# Patient Record
Sex: Female | Born: 1980
Health system: Southern US, Community
[De-identification: ages and names within clinical notes are randomized; demographics above are authoritative.]

## PROBLEM LIST (undated history)

## (undated) DIAGNOSIS — G35 Multiple sclerosis: Secondary | ICD-10-CM

## (undated) DIAGNOSIS — G35D Multiple sclerosis, unspecified: Secondary | ICD-10-CM

## (undated) DIAGNOSIS — D649 Anemia, unspecified: Secondary | ICD-10-CM

## (undated) HISTORY — PX: WISDOM TOOTH EXTRACTION: SHX21

## (undated) HISTORY — DX: Multiple sclerosis: G35

## (undated) HISTORY — DX: Multiple sclerosis, unspecified: G35.D

## (undated) HISTORY — DX: Anemia, unspecified: D64.9

---

## 2005-09-13 ENCOUNTER — Emergency Department: Payer: Self-pay | Admitting: Emergency Medicine

## 2005-09-13 ENCOUNTER — Other Ambulatory Visit: Payer: Self-pay

## 2005-09-17 ENCOUNTER — Ambulatory Visit: Payer: Self-pay | Admitting: Emergency Medicine

## 2007-07-24 ENCOUNTER — Emergency Department: Payer: Self-pay | Admitting: Emergency Medicine

## 2009-09-01 ENCOUNTER — Emergency Department: Payer: Self-pay | Admitting: Emergency Medicine

## 2009-09-08 ENCOUNTER — Emergency Department: Payer: Self-pay | Admitting: Emergency Medicine

## 2010-02-09 ENCOUNTER — Observation Stay: Payer: Self-pay | Admitting: Obstetrics and Gynecology

## 2010-03-19 ENCOUNTER — Inpatient Hospital Stay: Payer: Self-pay

## 2014-02-01 ENCOUNTER — Emergency Department: Payer: Self-pay | Admitting: Emergency Medicine

## 2014-02-01 LAB — CBC
HCT: 36 % (ref 35.0–47.0)
HGB: 11.6 g/dL — AB (ref 12.0–16.0)
MCH: 27.5 pg (ref 26.0–34.0)
MCHC: 32.1 g/dL (ref 32.0–36.0)
MCV: 86 fL (ref 80–100)
Platelet: 300 10*3/uL (ref 150–440)
RBC: 4.21 10*6/uL (ref 3.80–5.20)
RDW: 13.6 % (ref 11.5–14.5)
WBC: 11.3 10*3/uL — ABNORMAL HIGH (ref 3.6–11.0)

## 2014-02-01 LAB — COMPREHENSIVE METABOLIC PANEL
ALBUMIN: 4.1 g/dL (ref 3.4–5.0)
AST: 17 U/L (ref 15–37)
Alkaline Phosphatase: 60 U/L (ref 46–116)
Anion Gap: 9 (ref 7–16)
BUN: 8 mg/dL (ref 7–18)
Bilirubin,Total: 0.2 mg/dL (ref 0.2–1.0)
CALCIUM: 9.2 mg/dL (ref 8.5–10.1)
CO2: 26 mmol/L (ref 21–32)
Chloride: 106 mmol/L (ref 98–107)
Creatinine: 0.61 mg/dL (ref 0.60–1.30)
EGFR (African American): >60
Glucose: 83 mg/dL (ref 65–99)
Osmolality: 279 (ref 275–301)
POTASSIUM: 3.7 mmol/L (ref 3.5–5.1)
SGPT (ALT): 34 U/L (ref 14–63)
SODIUM: 141 mmol/L (ref 136–145)
Total Protein: 7.5 g/dL (ref 6.4–8.2)

## 2014-03-06 ENCOUNTER — Ambulatory Visit: Payer: Self-pay | Admitting: Nurse Practitioner

## 2014-04-19 ENCOUNTER — Ambulatory Visit
Admit: 2014-04-19 | Disposition: A | Discharge status: Home / Self Care | Payer: Self-pay | Attending: Neurology | Admitting: Neurology

## 2014-04-20 ENCOUNTER — Ambulatory Visit
Admit: 2014-04-20 | Disposition: A | Discharge status: Home / Self Care | Payer: Self-pay | Attending: Neurology | Admitting: Neurology

## 2014-04-21 ENCOUNTER — Other Ambulatory Visit: Payer: Self-pay | Admitting: Neurology

## 2014-04-21 ENCOUNTER — Ambulatory Visit
Admit: 2014-04-21 | Disposition: A | Discharge status: Home / Self Care | Payer: Self-pay | Attending: Neurology | Admitting: Neurology

## 2014-04-21 DIAGNOSIS — G939 Disorder of brain, unspecified: Secondary | ICD-10-CM

## 2014-04-22 ENCOUNTER — Ambulatory Visit
Admit: 2014-04-22 | Disposition: A | Discharge status: Home / Self Care | Payer: Self-pay | Attending: Neurology | Admitting: Neurology

## 2014-04-23 ENCOUNTER — Ambulatory Visit
Admit: 2014-04-23 | Disposition: A | Discharge status: Home / Self Care | Payer: Self-pay | Attending: Neurology | Admitting: Neurology

## 2014-05-05 ENCOUNTER — Other Ambulatory Visit: Payer: Self-pay | Admitting: Neurology

## 2014-05-05 DIAGNOSIS — G939 Disorder of brain, unspecified: Secondary | ICD-10-CM

## 2014-05-07 ENCOUNTER — Ambulatory Visit: Admission: RE | Admit: 2014-05-07 | Payer: Self-pay | Source: Ambulatory Visit

## 2014-05-10 ENCOUNTER — Telehealth: Payer: Self-pay | Admitting: *Deleted

## 2014-05-10 ENCOUNTER — Other Ambulatory Visit: Payer: Self-pay | Admitting: Diagnostic Radiology

## 2014-05-10 DIAGNOSIS — R519 Headache, unspecified: Secondary | ICD-10-CM

## 2014-05-10 DIAGNOSIS — R51 Headache: Principal | ICD-10-CM

## 2014-05-10 NOTE — Telephone Encounter (Signed)
Spoke with patient on 05/06/14.  Confirmed appt. Date/time.   Pre-procedure instructions reviewed.  Patient states she is not taking any anti-coagulants.

## 2014-05-11 ENCOUNTER — Ambulatory Visit
Admission: RE | Admit: 2014-05-11 | Discharge: 2014-05-11 | Disposition: A | Discharge status: Home / Self Care | Payer: Medicaid Other | Source: Ambulatory Visit | Attending: Neurology | Admitting: Neurology

## 2014-05-11 DIAGNOSIS — G35 Multiple sclerosis: Secondary | ICD-10-CM | POA: Diagnosis not present

## 2014-05-11 DIAGNOSIS — G939 Disorder of brain, unspecified: Secondary | ICD-10-CM

## 2014-05-11 LAB — ALBUMIN: Albumin: 4.3 g/dL (ref 3.5–5.0)

## 2014-05-11 LAB — CBC
HCT: 37.9 % (ref 35.0–47.0)
HEMOGLOBIN: 12.1 g/dL (ref 12.0–16.0)
MCH: 27.4 pg (ref 26.0–34.0)
MCHC: 31.8 g/dL — ABNORMAL LOW (ref 32.0–36.0)
MCV: 86 fL (ref 80.0–100.0)
Platelets: 251 10*3/uL (ref 150–440)
RBC: 4.41 MIL/uL (ref 3.80–5.20)
RDW: 14.1 % (ref 11.5–14.5)
WBC: 7.4 10*3/uL (ref 3.6–11.0)

## 2014-05-11 LAB — GLUCOSE, CSF: GLUCOSE CSF: 57 mg/dL (ref 40–70)

## 2014-05-11 LAB — CSF CELL COUNT WITH DIFFERENTIAL
EOS CSF: 0 %
LYMPHS CSF: 89 %
Monocyte-Macrophage-Spinal Fluid: 11 %
Other Cells, CSF: 0
RBC Count, CSF: 10 /mm3 — ABNORMAL HIGH (ref 0–3)
SEGMENTED NEUTROPHILS-CSF: 0 %
Tube #: 1
WBC, CSF: 2 /mm3

## 2014-05-11 LAB — PROTIME-INR
INR: 0.97
Prothrombin Time: 13.1 seconds (ref 11.4–15.0)

## 2014-05-11 LAB — HCG, QUANTITATIVE, PREGNANCY: hCG, Beta Chain, Quant, S: <1 m[IU]/mL (ref <5)

## 2014-05-11 LAB — PROTEIN, CSF: TOTAL PROTEIN, CSF: 19 mg/dL (ref 15–45)

## 2014-05-11 MED ORDER — ACETAMINOPHEN 325 MG PO TABS
650.0000 mg | ORAL_TABLET | ORAL | Status: DC | PRN
  Filled 2014-05-11: qty 2

## 2014-05-11 NOTE — Progress Notes (Signed)
Patient ID: Monica Wilkins, female   DOB: 16-Nov-1980, 34 y.o.   MRN: [id number] L2-3 LP with single pass 22g spinal needle.  8 cc clear CSF collected and sent for labs per request.  No immediate complications.

## 2014-05-12 LAB — IGG: IgG (Immunoglobin G), Serum: 1225 mg/dL (ref 700–1600)

## 2014-05-13 LAB — OLIGOCLONAL BANDS, CSF + SERM

## 2014-05-13 LAB — CSF IGG: IGG CSF: 3.3 mg/dL (ref 0.0–8.6)

## 2014-05-15 LAB — CSF CULTURE: CULTURE: NO GROWTH

## 2014-05-15 LAB — CSF CULTURE W GRAM STAIN

## 2014-07-06 ENCOUNTER — Other Ambulatory Visit: Payer: Self-pay | Admitting: Neurology

## 2014-07-06 DIAGNOSIS — R9089 Other abnormal findings on diagnostic imaging of central nervous system: Secondary | ICD-10-CM

## 2014-07-12 ENCOUNTER — Ambulatory Visit
Admission: RE | Admit: 2014-07-12 | Discharge: 2014-07-12 | Disposition: A | Discharge status: Home / Self Care | Payer: Medicaid Other | Source: Ambulatory Visit | Attending: Neurology | Admitting: Neurology

## 2014-07-12 DIAGNOSIS — R9089 Other abnormal findings on diagnostic imaging of central nervous system: Secondary | ICD-10-CM | POA: Diagnosis present

## 2014-07-12 DIAGNOSIS — M4802 Spinal stenosis, cervical region: Secondary | ICD-10-CM | POA: Insufficient documentation

## 2014-07-12 DIAGNOSIS — M5092 Cervical disc disorder, unspecified, mid-cervical region: Secondary | ICD-10-CM | POA: Insufficient documentation

## 2014-07-12 DIAGNOSIS — Z09 Encounter for follow-up examination after completed treatment for conditions other than malignant neoplasm: Secondary | ICD-10-CM | POA: Diagnosis not present

## 2014-07-12 MED ORDER — GADOBENATE DIMEGLUMINE 529 MG/ML IV SOLN
20.0000 mL | Freq: Once | INTRAVENOUS | Status: AC | PRN
  Administered 2014-07-12: 20 mL via INTRAVENOUS

## 2015-11-23 ENCOUNTER — Ambulatory Visit
Admission: RE | Admit: 2015-11-23 | Discharge: 2015-11-23 | Disposition: A | Discharge status: Home / Self Care | Payer: Medicaid Other | Source: Ambulatory Visit | Attending: Nurse Practitioner | Admitting: Nurse Practitioner

## 2015-11-23 ENCOUNTER — Other Ambulatory Visit: Payer: Self-pay | Admitting: Nurse Practitioner

## 2015-11-23 DIAGNOSIS — R0600 Dyspnea, unspecified: Secondary | ICD-10-CM | POA: Diagnosis present

## 2016-10-22 ENCOUNTER — Encounter: Payer: Self-pay | Admitting: Obstetrics and Gynecology

## 2016-10-22 ENCOUNTER — Ambulatory Visit (INDEPENDENT_AMBULATORY_CARE_PROVIDER_SITE_OTHER): Payer: Medicaid Other | Admitting: Obstetrics and Gynecology

## 2016-10-22 VITALS — BP 103/59 | HR 81 | Wt 179.3 lb

## 2016-10-22 DIAGNOSIS — Z3491 Encounter for supervision of normal pregnancy, unspecified, first trimester: Secondary | ICD-10-CM | POA: Diagnosis not present

## 2016-10-22 LAB — POCT URINALYSIS DIPSTICK
Bilirubin, UA: NEGATIVE
Glucose, UA: NEGATIVE
KETONES UA: NEGATIVE
Leukocytes, UA: NEGATIVE
NITRITE UA: NEGATIVE
PH UA: 7 (ref 5.0–8.0)
Spec Grav, UA: 1.025 (ref 1.010–1.025)
Urobilinogen, UA: 0.2 E.U./dL

## 2016-10-22 NOTE — Progress Notes (Signed)
ROB- Pt is c/o nausea but have not taken any medication,

## 2016-10-24 ENCOUNTER — Other Ambulatory Visit: Payer: Medicaid Other

## 2016-10-24 ENCOUNTER — Ambulatory Visit (INDEPENDENT_AMBULATORY_CARE_PROVIDER_SITE_OTHER): Payer: Medicaid Other | Admitting: Obstetrics and Gynecology

## 2016-10-24 ENCOUNTER — Encounter: Payer: Self-pay | Admitting: Obstetrics and Gynecology

## 2016-10-24 VITALS — BP 98/61 | HR 82 | Wt 175.4 lb

## 2016-10-24 DIAGNOSIS — O09521 Supervision of elderly multigravida, first trimester: Secondary | ICD-10-CM

## 2016-10-24 DIAGNOSIS — G35 Multiple sclerosis: Secondary | ICD-10-CM

## 2016-10-24 DIAGNOSIS — Z3491 Encounter for supervision of normal pregnancy, unspecified, first trimester: Secondary | ICD-10-CM

## 2016-10-24 LAB — POCT URINALYSIS DIPSTICK
BILIRUBIN UA: NEGATIVE
GLUCOSE UA: NEGATIVE
Ketones, UA: NEGATIVE
Leukocytes, UA: NEGATIVE
Nitrite, UA: NEGATIVE
Protein, UA: NEGATIVE
RBC UA: NEGATIVE
SPEC GRAV UA: 1.01 (ref 1.010–1.025)
Urobilinogen, UA: 0.2 E.U./dL
pH, UA: 7.5 (ref 5.0–8.0)

## 2016-10-24 NOTE — Progress Notes (Signed)
HPI:      [name] is a 36 y.o. [id number] who LMP was Patient's last menstrual period was 08/01/2016 (approximate).  Subjective:   She presents today for her new OB visit.-She does not know her menstrual period dating. She is not having any complications with this pregnancy. She has a history of multiple sclerosis and has discontinued her medication because she is pregnant.  She did this with her previous pregnancy and had no issues. She reports that her last Pap was normal but she has had some slightly abnormal ones in the past.    Hx: The following portions of the patient's history were reviewed and updated as appropriate:             She  has a past medical history of Anemia and MS (multiple sclerosis) (HCC). She  does not have a problem list on file. She  has no past surgical history on file. Her family history is not on file. She  reports that she has never smoked. She has never used smokeless tobacco. She reports that she does not drink alcohol or use drugs. She has No Known Allergies.       Review of Systems:  Review of Systems  Constitutional: Denied constitutional symptoms, night sweats, recent illness, fatigue, fever, insomnia and weight loss.  Eyes: Denied eye symptoms, eye pain, photophobia, vision change and visual disturbance.  Ears/Nose/Throat/Neck: Denied ear, nose, throat or neck symptoms, hearing loss, nasal discharge, sinus congestion and sore throat.  Cardiovascular: Denied cardiovascular symptoms, arrhythmia, chest pain/pressure, edema, exercise intolerance, orthopnea and palpitations.  Respiratory: Denied pulmonary symptoms, asthma, pleuritic pain, productive sputum, cough, dyspnea and wheezing.  Gastrointestinal: Denied, gastro-esophageal reflux, melena, nausea and vomiting.  Genitourinary: Denied genitourinary symptoms including symptomatic vaginal discharge, pelvic relaxation issues, and urinary complaints.  Musculoskeletal: Denied musculoskeletal  symptoms, stiffness, swelling, muscle weakness and myalgia.  Dermatologic: Denied dermatology symptoms, rash and scar.  Neurologic: Denied neurology symptoms, dizziness, headache, neck pain and syncope.  Psychiatric: Denied psychiatric symptoms, anxiety and depression.  Endocrine: Denied endocrine symptoms including hot flashes and night sweats.   Meds:   Current Outpatient Prescriptions on File Prior to Visit  Medication Sig Dispense Refill  . ferrous fumarate (FERRO-SEQUELS) 50 MG CR tablet Take 50 mg by mouth daily.    . Prenatal MV & Min w/FA-DHA (PRENATAL ADULT GUMMY/DHA/FA PO) Take by mouth.     No current facility-administered medications on file prior to visit.     Objective:     Vitals:   10/24/16 1204  BP: 98/61  Pulse: 82              Physical examination General NAD, Conversant  HEENT Atraumatic; Op clear with mmm.  Normo-cephalic. Pupils reactive. Anicteric sclerae  Thyroid/Neck Smooth without nodularity or enlargement. Normal ROM.  Neck Supple.  Skin No rashes, lesions or ulceration. Normal palpated skin turgor. No nodularity.  Breasts: No masses or discharge.  Symmetric.  No axillary adenopathy.  Lungs: Clear to auscultation.No rales or wheezes. Normal Respiratory effort, no retractions.  Heart: NSR.  No murmurs or rubs appreciated. No periferal edema  Abdomen: Soft.  Non-tender.  No masses.  No HSM. No hernia  Extremities: Moves all appropriately.  Normal ROM for age. No lymphadenopathy.  Neuro: Oriented to PPT.  Normal mood. Normal affect.     Pelvic:   Vulva: Normal appearance.  No lesions.  Vagina: No lesions or abnormalities noted.  Support: Normal pelvic support.  Urethra No masses tenderness  or scarring.  Meatus Normal size without lesions or prolapse.  Cervix: Normal appearance.  No lesions.  Anus: Normal exam.  No lesions.  Perineum: Normal exam.  No lesions.        Bimanual   Adnexae: No masses.  Non-tender to palpation.  Uterus: Enlarged.  13wks  Non-tender.  Mobile.  AV.  Pos FHT's  Adnexae: No masses.  Non-tender to palpation.  Cul-de-sac: Negative for abnormality.  Adnexae: No masses.  Non-tender to palpation.         Pelvimetry   Diagonal: Reached.  Spines: Average.  Sacrum: Concave.  Pubic Arch: Normal.      Assessment:    G3P2002 There are no active problems to display for this patient.    1. First trimester pregnancy   2. Advanced maternal age in multigravida, first trimester   3. Multiple sclerosis (HCC)        Plan:             Prenatal Plan 1.  The patient was given prenatal literature. 2.  She was continued on prenatal vitamins. 3.  A prenatal lab panel was ordered or drawn. 4.  An ultrasound was ordered to better determine an EDC. 5.  Maternity 21 today 6.   A general overview of pregnancy testing, visit schedule, ultrasound schedule, and prenatal care was discussed.   Orders No orders of the defined types were placed in this encounter.   No orders of the defined types were placed in this encounter.     F/U  Return in about 4 weeks (around 11/21/2016).  Elonda Husky, M.D. 10/24/2016 12:37 PM

## 2016-10-26 LAB — CBC WITH DIFFERENTIAL
BASOS ABS: 0 10*3/uL (ref 0.0–0.2)
Basos: 0 %
EOS (ABSOLUTE): 0.2 10*3/uL (ref 0.0–0.4)
Eos: 2 %
HEMOGLOBIN: 11.9 g/dL (ref 11.1–15.9)
Hematocrit: 37.7 % (ref 34.0–46.6)
IMMATURE GRANS (ABS): 0 10*3/uL (ref 0.0–0.1)
IMMATURE GRANULOCYTES: 0 %
LYMPHS: 13 %
Lymphocytes Absolute: 1.2 10*3/uL (ref 0.7–3.1)
MCH: 27.5 pg (ref 26.6–33.0)
MCHC: 31.6 g/dL (ref 31.5–35.7)
MCV: 87 fL (ref 79–97)
MONOCYTES: 7 %
Monocytes Absolute: 0.6 10*3/uL (ref 0.1–0.9)
Neutrophils Absolute: 7.6 10*3/uL — ABNORMAL HIGH (ref 1.4–7.0)
Neutrophils: 78 %
RBC: 4.32 x10E6/uL (ref 3.77–5.28)
RDW: 13.4 % (ref 12.3–15.4)
WBC: 9.7 10*3/uL (ref 3.4–10.8)

## 2016-10-26 LAB — URINALYSIS, ROUTINE W REFLEX MICROSCOPIC
Bilirubin, UA: NEGATIVE
GLUCOSE, UA: NEGATIVE
Ketones, UA: NEGATIVE
LEUKOCYTES UA: NEGATIVE
Nitrite, UA: NEGATIVE
PROTEIN UA: NEGATIVE
RBC, UA: NEGATIVE
Specific Gravity, UA: 1.023 (ref 1.005–1.030)
Urobilinogen, Ur: 1 mg/dL (ref 0.2–1.0)
pH, UA: 7.5 (ref 5.0–7.5)

## 2016-10-26 LAB — ABO AND RH: RH TYPE: POSITIVE

## 2016-10-26 LAB — VARICELLA ZOSTER ANTIBODY, IGG: Varicella zoster IgG: 921 index (ref >165)

## 2016-10-26 LAB — RUBELLA SCREEN: RUBELLA: 2.37 {index} (ref >0.99)

## 2016-10-26 LAB — ANTIBODY SCREEN: Antibody Screen: NEGATIVE

## 2016-10-26 LAB — SICKLE CELL SCREEN: SICKLE CELL SCREEN: NEGATIVE

## 2016-10-26 LAB — HEPATITIS B SURFACE ANTIGEN: HEP B S AG: NEGATIVE

## 2016-10-26 LAB — HIV ANTIBODY (ROUTINE TESTING W REFLEX): HIV SCREEN 4TH GENERATION: NONREACTIVE

## 2016-10-26 LAB — RPR: RPR: NONREACTIVE

## 2016-10-27 LAB — URINE CULTURE

## 2016-10-29 LAB — MATERNIT 21 PLUS CORE, BLOOD
CHROMOSOME 13: NEGATIVE
CHROMOSOME 18: NEGATIVE
CHROMOSOME 21: NEGATIVE
Y CHROMOSOME: NOT DETECTED

## 2016-10-30 LAB — MONITOR DRUG PROFILE 14(MW)

## 2016-10-30 LAB — NICOTINE SCREEN, URINE

## 2016-11-01 ENCOUNTER — Other Ambulatory Visit: Payer: Self-pay | Admitting: Obstetrics and Gynecology

## 2016-11-01 DIAGNOSIS — O3680X0 Pregnancy with inconclusive fetal viability, not applicable or unspecified: Secondary | ICD-10-CM

## 2016-11-02 LAB — GC/CHLAMYDIA PROBE AMP
Chlamydia trachomatis, NAA: NEGATIVE
Neisseria gonorrhoeae by PCR: NEGATIVE

## 2016-11-05 ENCOUNTER — Ambulatory Visit (INDEPENDENT_AMBULATORY_CARE_PROVIDER_SITE_OTHER): Payer: Medicaid Other

## 2016-11-05 DIAGNOSIS — O3680X0 Pregnancy with inconclusive fetal viability, not applicable or unspecified: Secondary | ICD-10-CM | POA: Diagnosis not present

## 2016-11-21 ENCOUNTER — Ambulatory Visit (INDEPENDENT_AMBULATORY_CARE_PROVIDER_SITE_OTHER): Payer: Medicare Other | Admitting: Obstetrics and Gynecology

## 2016-11-21 ENCOUNTER — Encounter: Payer: Self-pay | Admitting: Obstetrics and Gynecology

## 2016-11-21 VITALS — BP 96/59 | HR 88 | Wt 177.9 lb

## 2016-11-21 DIAGNOSIS — Z3482 Encounter for supervision of other normal pregnancy, second trimester: Secondary | ICD-10-CM

## 2016-11-21 DIAGNOSIS — Z1379 Encounter for other screening for genetic and chromosomal anomalies: Secondary | ICD-10-CM

## 2016-11-21 DIAGNOSIS — Z23 Encounter for immunization: Secondary | ICD-10-CM

## 2016-11-21 LAB — POCT URINALYSIS DIPSTICK
BILIRUBIN UA: NEGATIVE
Blood, UA: NEGATIVE
GLUCOSE UA: NEGATIVE
Ketones, UA: NEGATIVE
Leukocytes, UA: NEGATIVE
NITRITE UA: NEGATIVE
Protein, UA: NEGATIVE
Spec Grav, UA: >=1.03 — AB (ref 1.010–1.025)
Urobilinogen, UA: 0.2 E.U./dL
pH, UA: 6 (ref 5.0–8.0)

## 2016-11-21 NOTE — Progress Notes (Signed)
ROB: Patient doing well. Notes occasional dyspneic spells lasting for a few seconds then resolving.  Denies palpitations. Will monitor during pregnancy. Discussed remedies for acne.  For AFP today.  Is planning on traveling in the next couple of weeks (to [place] Discussed travel precautions. Flu vaccine givne. RTC in 4 weeks. For anatomy scan at that time.

## 2016-11-21 NOTE — Progress Notes (Signed)
ROB- Pt states she is doing ok, just occ. Dyspnea, wanted to know when to expect her pelvic to spread

## 2016-11-26 ENCOUNTER — Telehealth: Payer: Self-pay | Admitting: Obstetrics and Gynecology

## 2016-11-26 MED ORDER — CONCEPT DHA 53.5-38-1 MG PO CAPS
1.0000 | ORAL_CAPSULE | Freq: Every day | ORAL | 10 refills | Status: DC
Start: 2016-11-26 — End: 2017-06-26

## 2016-11-26 NOTE — Telephone Encounter (Signed)
The patient called and stated that she was told that she would have prenatal samples left up front for her, but there aren't any samples left in the front office, The patient would like prenatal vitamins soon.  Please advise.

## 2016-11-26 NOTE — Telephone Encounter (Signed)
Spoke with pt and she requested an rx sent to her pharmacy for prenatals. This was sent to her pharmacy of choice. She also wanted a few samples. These was placed up front as well. She had no additional questions at this time. Nothing further is needed

## 2016-11-27 LAB — AFP, SERUM, OPEN SPINA BIFIDA
AFP MOM: 1.15
AFP VALUE AFPOSL: 36.3 ng/mL
Gest. Age on Collection Date: 16 weeks
MATERNAL AGE AT EDD: 36.3 a
OSBR Risk 1 IN: 10000
Test Results:: NEGATIVE
Weight: 178 [lb_av]

## 2016-12-17 ENCOUNTER — Ambulatory Visit (INDEPENDENT_AMBULATORY_CARE_PROVIDER_SITE_OTHER): Payer: Medicaid Other | Admitting: Certified Nurse Midwife

## 2016-12-17 ENCOUNTER — Ambulatory Visit (INDEPENDENT_AMBULATORY_CARE_PROVIDER_SITE_OTHER): Payer: Medicare Other

## 2016-12-17 VITALS — BP 101/55 | HR 84 | Wt 179.4 lb

## 2016-12-17 DIAGNOSIS — Z3482 Encounter for supervision of other normal pregnancy, second trimester: Secondary | ICD-10-CM | POA: Diagnosis not present

## 2016-12-17 LAB — POCT URINALYSIS DIPSTICK
BILIRUBIN UA: NEGATIVE
Blood, UA: NEGATIVE
Glucose, UA: NEGATIVE
Ketones, UA: NEGATIVE
Leukocytes, UA: NEGATIVE
Nitrite, UA: NEGATIVE
PROTEIN UA: NEGATIVE
Spec Grav, UA: 1.015 (ref 1.010–1.025)
Urobilinogen, UA: 0.2 E.U./dL
pH, UA: 7 (ref 5.0–8.0)

## 2016-12-17 NOTE — Patient Instructions (Signed)
Eating Plan for Pregnant Women While you are pregnant, your body will require additional nutrition to help support your growing baby. It is recommended that you consume:  150 additional calories each day during your first trimester.  300 additional calories each day during your second trimester.  300 additional calories each day during your third trimester.  Eating a healthy, well-balanced diet is very important for your health and for your baby's health. You also have a higher need for some vitamins and minerals, such as folic acid, calcium, iron, and vitamin D. What do I need to know about eating during pregnancy?  Do not try to lose weight or go on a diet during pregnancy.  Choose healthy, nutritious foods. Choose  of a sandwich with a glass of milk instead of a candy bar or a high-calorie sugar-sweetened beverage.  Limit your overall intake of foods that have "empty calories." These are foods that have little nutritional value, such as sweets, desserts, candies, sugar-sweetened beverages, and fried foods.  Eat a variety of foods, especially fruits and vegetables.  Take a prenatal vitamin to help meet the additional needs during pregnancy, specifically for folic acid, iron, calcium, and vitamin D.  Remember to stay active. Ask your health care provider for exercise recommendations that are specific to you.  Practice good food safety and cleanliness, such as washing your hands before you eat and after you prepare raw meat. This helps to prevent foodborne illnesses, such as listeriosis, that can be very dangerous for your baby. Ask your health care provider for more information about listeriosis. What does 150 extra calories look like? Healthy options for an additional 150 calories each day could be any of the following:  Plain low-fat yogurt (6-8 oz) with  cup of berries.  1 apple with 2 teaspoons of peanut butter.  Cut-up vegetables with  cup of hummus.  Low-fat chocolate milk  (8 oz or 1 cup).  1 string cheese with 1 medium orange.   of a peanut butter and jelly sandwich on whole-wheat bread (1 tsp of peanut butter).  For 300 calories, you could eat two of those healthy options each day. What is a healthy amount of weight to gain? The recommended amount of weight for you to gain is based on your pre-pregnancy BMI. If your pre-pregnancy BMI was:  Less than 18 (underweight), you should gain 28-40 lb.  18-24.9 (normal), you should gain 25-35 lb.  25-29.9 (overweight), you should gain 15-25 lb.  Greater than 30 (obese), you should gain 11-20 lb.  What if I am having twins or multiples? Generally, pregnant women who will be having twins or multiples may need to increase their daily calories by 300-600 calories each day. The recommended range for total weight gain is 25-54 lb, depending on your pre-pregnancy BMI. Talk with your health care provider for specific guidance about additional nutritional needs, weight gain, and exercise during your pregnancy. What foods can I eat? Grains Any grains. Try to choose whole grains, such as whole-wheat bread, oatmeal, or brown rice. Vegetables Any vegetables. Try to eat a variety of colors and types of vegetables to get a full range of vitamins and minerals. Remember to wash your vegetables well before eating. Fruits Any fruits. Try to eat a variety of colors and types of fruit to get a full range of vitamins and minerals. Remember to wash your fruits well before eating. Meats and Other Protein Sources Lean meats, including chicken, [place], fish, and lean cuts of beef, veal,  or pork. Make sure that all meats are cooked to "well done." Tofu. Tempeh. Beans. Eggs. Peanut butter and other nut butters. Seafood, such as shrimp, crab, and lobster. If you choose fish, select types that are higher in omega-3 fatty acids, including salmon, herring, mussels, trout, sardines, and pollock. Make sure that all meats are cooked to food-safe  temperatures. Dairy Pasteurized milk and milk alternatives. Pasteurized yogurt and pasteurized cheese. Cottage cheese. Sour cream. Beverages Water. Juices that contain 100% fruit juice or vegetable juice. Caffeine-free teas and decaffeinated coffee. Drinks that contain caffeine are okay to drink, but it is better to avoid caffeine. Keep your total caffeine intake to less than 200 mg each day (12 oz of coffee, tea, or soda) or as directed by your health care provider. Condiments Any pasteurized condiments. Sweets and Desserts Any sweets and desserts. Fats and Oils Any fats and oils. The items listed above may not be a complete list of recommended foods or beverages. Contact your dietitian for more options. What foods are not recommended? Vegetables Unpasteurized (raw) vegetable juices. Fruits Unpasteurized (raw) fruit juices. Meats and Other Protein Sources Cured meats that have nitrates, such as bacon, salami, and hotdogs. Luncheon meats, bologna, or other deli meats (unless they are reheated until they are steaming hot). Refrigerated pate, meat spreads from a meat counter, smoked seafood that is found in the refrigerated section of a store. Raw fish, such as sushi or sashimi. High mercury content fish, such as tilefish, shark, swordfish, and king mackerel. Raw meats, such as tuna or beef tartare. Undercooked meats and poultry. Make sure that all meats are cooked to food-safe temperatures. Dairy Unpasteurized (raw) milk and any foods that have raw milk in them. Soft cheeses, such as feta, queso blanco, queso fresco, Brie, Camembert cheeses, blue-veined cheeses, and Panela cheese (unless it is made with pasteurized milk, which must be stated on the label). Beverages Alcohol. Sugar-sweetened beverages, such as sodas, teas, or energy drinks. Condiments Homemade fermented foods and drinks, such as pickles, sauerkraut, or kombucha drinks. (Store-bought pasteurized versions of these are  okay.) Other Salads that are made in the store, such as ham salad, chicken salad, egg salad, tuna salad, and seafood salad. The items listed above may not be a complete list of foods and beverages to avoid. Contact your dietitian for more information. This information is not intended to replace advice given to you by your health care provider. Make sure you discuss any questions you have with your health care provider. Document Released: 10/02/2013 Document Revised: 05/26/2015 Document Reviewed: 06/02/2013 Elsevier Interactive Patient Education  2018 [employer]. Pregnancy and Anemia Anemia is a condition in which the concentration of red blood cells or hemoglobin in the blood is below normal. Hemoglobin is a substance in red blood cells that carries oxygen to the tissues of the body. Anemia results in not enough oxygen reaching these tissues. Anemia during pregnancy is common because the fetus uses more iron and folic acid as it is developing. Your body may not produce enough red blood cells because of this. Also, during pregnancy, the liquid part of the blood (plasma) increases by about 50%, and the red blood cells increase by only 25%. This lowers the concentration of the red blood cells and creates a natural anemia-like situation. What are the causes? The most common cause of anemia during pregnancy is not having enough iron in the body to make red blood cells (iron deficiency anemia). Other causes may include:  Folic acid deficiency.  Vitamin B12 deficiency.  Certain prescription or over-the-counter medicines.  Certain medical conditions or infections that destroy red blood cells.  A low platelet count and bleeding caused by antibodies that go through the placenta to the fetus from the mother's blood.  What are the signs or symptoms? Mild anemia may not be noticeable. If it becomes severe, symptoms may include:  Tiredness.  Shortness of breath, especially with  exercise.  Weakness.  Fainting.  Pale looking skin.  Headaches.  Feeling a fast or irregular heartbeat (palpitations).  How is this diagnosed? The type of anemia is usually diagnosed from your family and medical history and blood tests. How is this treated? Treatment of anemia during pregnancy depends on the cause of the anemia. Treatment can include:  Supplements of iron, vitamin B12, or folic acid.  A blood transfusion. This may be needed if blood loss is severe.  Hospitalization. This may be needed if there is significant continual blood loss.  Dietary changes.  Follow these instructions at home:  Follow your dietitian's or health care provider's dietary recommendations.  Increase your vitamin C intake. This will help the stomach absorb more iron.  Eat a diet rich in iron. This would include foods such as: ? Liver. ? Beef. ? Whole grain bread. ? Eggs. ? Dried fruit.  Take iron and vitamins as directed by your health care provider.  Eat green leafy vegetables. These are a good source of folic acid. Contact a health care provider if:  You have frequent or lasting headaches.  You are looking pale.  You are bruising easily. Get help right away if:  You have extreme weakness, shortness of breath, or chest pain.  You become dizzy or have trouble concentrating.  You have heavy vaginal bleeding.  You develop a rash.  You have bloody or black, tarry stools.  You faint.  You vomit up blood.  You vomit repeatedly.  You have abdominal pain.  You have a fever or persistent symptoms for more than 2-3 days.  You have a fever and your symptoms suddenly get worse.  You are dehydrated. This information is not intended to replace advice given to you by your health care provider. Make sure you discuss any questions you have with your health care provider. Document Released: 12/16/1999 Document Revised: 05/26/2015 Document Reviewed: 07/30/2012 Elsevier  Interactive Patient Education  2017 [employer]

## 2016-12-17 NOTE — Progress Notes (Addendum)
ROB-Doing well, reports fatigue and round ligament pain. Discussed home treatment measures including use of abdominal support. EDD updated based on first trimester [place] completed on 11/05/2016. EDD: 05/16/2017 which makes anatomy scan today complete and WNL. Findings reviewed with patient and family members. Female will be named "Monica Wilkins". Reviewed red flag symptoms and when to call. RTC x 4 weeks for ROB with Dr. Logan Bores (she is a physician pt inadvertently scheduled with the midwives) or sooner if needed.   ULTRASOUND REPORT  Location: [facility] Date of Service:  12/17/16  Indications: Anatomy Findings:  Mason Jim intrauterine pregnancy is visualized with FHR at 155 BPM. Biometrics give an (U/S) Gestational age of 27 1/7 weeks and an (U/S) EDD of 05/19/17; this DOES NOT correlate with the clinically established EDD of 05/08/17.  Fetal presentation is breech.  EFW: 226 grams (0lb 8oz). Placenta: Posterior and grade 1.  Placenta is 5.0 cm from cervical os. AFI: WNL subjectively.  Anatomic survey is complete and appears WNL; Gender - Female.   Right Ovary measures 4.1 x 3.4 x 2.5 cm and appears WNL.  Left Ovary measures 2.1 x 2.1 x 1.9 cm and appears WNL.  There is no obvious evidence of a corpus luteal cyst. Survey of the adnexa demonstrates no adnexal masses. There is no free peritoneal fluid in the cul de sac.  Impression: 1. 18 1/7 week Viable Singleton Intrauterine pregnancy by U/S. 2. (U/S) EDD IS NOT consistent with Clinically established (LMP) EDD of 05/08/17. 3. Anatomy Scan Complete 4. Today's ultrasound examination demonstrates EDD of 05/19/17.  Recommendations: 1.Clinical correlation with the patient's History and Physical Exam. 2. EDD should be adjusted to match today's ultrasound examination of 05/19/17.

## 2017-01-01 NOTE — L&D Delivery Note (Addendum)
Delivery Note  Diagnoses: 1.  39.6-week intrauterine pregnancy, delivered 2.  Viable female 3.  Nuchal cord x1, tight, reduced 4.  True knot 5.  GBS positive 6.  AMA 7.  A positive blood type  Procedures: 1.  IUPC/FSE 2.  Amnioinfusion for moderate variable decelerations 3.  GBS antibiotic prophylaxis 4.  SVD  At 5:45 PM a viable female child was delivered via Vaginal, Spontaneous (Presentation: Vertex; ROA).  APGAR: 9, 9; weight pending.   Placenta status: 3 vessels intact, expressed.  Cord:  with the following complications: Tight nuchal cord x1, reduced; true knot in cord.  Cord pH: na  Anesthesia: Epidural Episiotomy: None Lacerations: None Suture Repair: NA Est. Blood Loss (mL): 350  Mom to postpartum.  Baby to Couplet care / Skin to Skin.  Daphine Deutscher A Merlyn Bollen 05/15/2017, 6:00 PM

## 2017-01-14 ENCOUNTER — Encounter: Payer: Medicare Other | Admitting: Obstetrics and Gynecology

## 2017-01-15 ENCOUNTER — Ambulatory Visit (INDEPENDENT_AMBULATORY_CARE_PROVIDER_SITE_OTHER): Payer: Medicare Other | Admitting: Obstetrics and Gynecology

## 2017-01-15 ENCOUNTER — Encounter: Payer: Self-pay | Admitting: Obstetrics and Gynecology

## 2017-01-15 VITALS — BP 90/54 | HR 87 | Wt 183.4 lb

## 2017-01-15 DIAGNOSIS — Z3482 Encounter for supervision of other normal pregnancy, second trimester: Secondary | ICD-10-CM

## 2017-01-15 LAB — POCT URINALYSIS DIPSTICK
Bilirubin, UA: NEGATIVE
Glucose, UA: NEGATIVE
Ketones, UA: NEGATIVE
LEUKOCYTES UA: NEGATIVE
Nitrite, UA: NEGATIVE
Protein, UA: NEGATIVE
RBC UA: NEGATIVE
Spec Grav, UA: 1.015 (ref 1.010–1.025)
Urobilinogen, UA: 0.2 E.U./dL
pH, UA: 6.5 (ref 5.0–8.0)

## 2017-01-15 NOTE — Progress Notes (Signed)
ROB: Patient without complaint.  Dating reviewed best date is 05-16-17.  Reports active fetal movement.  Patient not a fan of milk-will do Tums with calcium instead.

## 2017-02-12 ENCOUNTER — Ambulatory Visit: Payer: Medicare Other | Admitting: Obstetrics and Gynecology

## 2017-02-12 VITALS — BP 90/55 | HR 94 | Wt 187.0 lb

## 2017-02-12 DIAGNOSIS — Z113 Encounter for screening for infections with a predominantly sexual mode of transmission: Secondary | ICD-10-CM

## 2017-02-12 DIAGNOSIS — Z130 Encounter for screening for diseases of the blood and blood-forming organs and certain disorders involving the immune mechanism: Secondary | ICD-10-CM

## 2017-02-12 DIAGNOSIS — Z3482 Encounter for supervision of other normal pregnancy, second trimester: Secondary | ICD-10-CM

## 2017-02-12 DIAGNOSIS — Z3490 Encounter for supervision of normal pregnancy, unspecified, unspecified trimester: Secondary | ICD-10-CM | POA: Insufficient documentation

## 2017-02-12 DIAGNOSIS — Z131 Encounter for screening for diabetes mellitus: Secondary | ICD-10-CM

## 2017-02-12 LAB — POCT URINALYSIS DIPSTICK
Bilirubin, UA: NEGATIVE
Glucose, UA: NEGATIVE
KETONES UA: NEGATIVE
NITRITE UA: NEGATIVE
PH UA: 8 (ref 5.0–8.0)
Protein, UA: NEGATIVE
RBC UA: NEGATIVE
Spec Grav, UA: 1.01 (ref 1.010–1.025)
UROBILINOGEN UA: 0.2 U/dL

## 2017-02-12 NOTE — Progress Notes (Signed)
Pt is doing well.

## 2017-02-12 NOTE — Progress Notes (Signed)
ROB: Patient doing well, no complaints.  Discussed contraception, patient is considering Mirena.  Given handout. Will breastfeed. Given info on [facility] birthplace tours and classes. RTC in 2 weeks, for 28 week labs at that time.

## 2017-02-18 ENCOUNTER — Telehealth: Payer: Self-pay

## 2017-02-18 NOTE — Telephone Encounter (Signed)
Pt called and informed that the FMLA papers for her husband was completed and was being fax to the number provided on the paperwork.

## 2017-02-26 ENCOUNTER — Other Ambulatory Visit: Payer: Medicare Other

## 2017-02-26 ENCOUNTER — Ambulatory Visit (INDEPENDENT_AMBULATORY_CARE_PROVIDER_SITE_OTHER): Payer: Medicare Other | Admitting: Obstetrics and Gynecology

## 2017-02-26 VITALS — BP 108/68 | HR 93 | Wt 189.7 lb

## 2017-02-26 DIAGNOSIS — Z131 Encounter for screening for diabetes mellitus: Secondary | ICD-10-CM

## 2017-02-26 DIAGNOSIS — Z113 Encounter for screening for infections with a predominantly sexual mode of transmission: Secondary | ICD-10-CM

## 2017-02-26 DIAGNOSIS — Z3493 Encounter for supervision of normal pregnancy, unspecified, third trimester: Secondary | ICD-10-CM

## 2017-02-26 DIAGNOSIS — Z130 Encounter for screening for diseases of the blood and blood-forming organs and certain disorders involving the immune mechanism: Secondary | ICD-10-CM

## 2017-02-26 LAB — POCT URINALYSIS DIPSTICK
BILIRUBIN UA: NEGATIVE
Glucose, UA: NEGATIVE
KETONES UA: NEGATIVE
Nitrite, UA: NEGATIVE
Protein, UA: NEGATIVE
RBC UA: NEGATIVE
SPEC GRAV UA: 1.015 (ref 1.010–1.025)
Urobilinogen, UA: 0.2 E.U./dL
pH, UA: 6.5 (ref 5.0–8.0)

## 2017-02-26 MED ORDER — TETANUS-DIPHTH-ACELL PERTUSSIS 5-2.5-18.5 LF-MCG/0.5 IM SUSP: 0.5000 mL | Freq: Once | INTRAMUSCULAR | Status: DC

## 2017-02-26 NOTE — Progress Notes (Signed)
ROB- glucola done, blood consent signed, pt is doing well 

## 2017-02-26 NOTE — Progress Notes (Signed)
ROB: No complaints.  Reports active baby.  One hour GCT today.

## 2017-02-27 LAB — RPR: RPR Ser Ql: NONREACTIVE

## 2017-02-27 LAB — GLUCOSE, 1 HOUR GESTATIONAL: Gestational Diabetes Screen: 85 mg/dL (ref 65–139)

## 2017-02-27 LAB — CBC
HEMOGLOBIN: 9.7 g/dL — AB (ref 11.1–15.9)
Hematocrit: 30 % — ABNORMAL LOW (ref 34.0–46.6)
MCH: 27.3 pg (ref 26.6–33.0)
MCHC: 32.3 g/dL (ref 31.5–35.7)
MCV: 85 fL (ref 79–97)
Platelets: 233 10*3/uL (ref 150–379)
RBC: 3.55 x10E6/uL — AB (ref 3.77–5.28)
RDW: 13.6 % (ref 12.3–15.4)
WBC: 9.4 10*3/uL (ref 3.4–10.8)

## 2017-03-04 ENCOUNTER — Other Ambulatory Visit: Payer: Self-pay

## 2017-03-04 MED ORDER — DOCUSATE SODIUM 50 MG PO CAPS: 50.0000 mg | ORAL_CAPSULE | Freq: Two times a day (BID) | ORAL | 0 refills | Status: DC

## 2017-03-04 MED ORDER — FERROUS SULFATE 325 (65 FE) MG PO TABS: 325.0000 mg | ORAL_TABLET | Freq: Every day | ORAL | 3 refills | Status: DC

## 2017-03-19 ENCOUNTER — Ambulatory Visit (INDEPENDENT_AMBULATORY_CARE_PROVIDER_SITE_OTHER): Payer: Medicare Other | Admitting: Obstetrics and Gynecology

## 2017-03-19 VITALS — BP 95/58 | HR 87 | Wt 193.2 lb

## 2017-03-19 DIAGNOSIS — O99013 Anemia complicating pregnancy, third trimester: Secondary | ICD-10-CM

## 2017-03-19 DIAGNOSIS — O26893 Other specified pregnancy related conditions, third trimester: Secondary | ICD-10-CM

## 2017-03-19 DIAGNOSIS — Z3483 Encounter for supervision of other normal pregnancy, third trimester: Secondary | ICD-10-CM

## 2017-03-19 DIAGNOSIS — N949 Unspecified condition associated with female genital organs and menstrual cycle: Secondary | ICD-10-CM

## 2017-03-19 LAB — POCT URINALYSIS DIPSTICK
Bilirubin, UA: NEGATIVE
Blood, UA: NEGATIVE
GLUCOSE UA: NEGATIVE
KETONES UA: NEGATIVE
LEUKOCYTES UA: NEGATIVE
Nitrite, UA: NEGATIVE
PROTEIN UA: NEGATIVE
Spec Grav, UA: 1.01 (ref 1.010–1.025)
Urobilinogen, UA: 0.2 E.U./dL
pH, UA: 7.5 (ref 5.0–8.0)

## 2017-03-19 NOTE — Patient Instructions (Signed)

## 2017-03-19 NOTE — Progress Notes (Signed)
ROB-pt is having lower pelvic pressure.

## 2017-03-19 NOTE — Progress Notes (Signed)
ROB: Feeling pelvic pressure. Reviewed labs, mildlyanemic, now taking iron pills. Normal glucola.  RTC in 2 weeks.

## 2017-04-02 ENCOUNTER — Ambulatory Visit (INDEPENDENT_AMBULATORY_CARE_PROVIDER_SITE_OTHER): Payer: Medicare Other | Admitting: Obstetrics and Gynecology

## 2017-04-02 ENCOUNTER — Encounter: Payer: Self-pay | Admitting: Obstetrics and Gynecology

## 2017-04-02 VITALS — BP 111/65 | HR 93 | Wt 194.2 lb

## 2017-04-02 DIAGNOSIS — Z3483 Encounter for supervision of other normal pregnancy, third trimester: Secondary | ICD-10-CM

## 2017-04-02 LAB — POCT URINALYSIS DIPSTICK
Blood, UA: NEGATIVE
Glucose, UA: NEGATIVE
Nitrite, UA: NEGATIVE
ODOR: NEGATIVE
Spec Grav, UA: 1.02 (ref 1.010–1.025)
Urobilinogen, UA: 0.2 E.U./dL
pH, UA: 6.5 (ref 5.0–8.0)

## 2017-04-02 NOTE — Progress Notes (Signed)
ROB: Doing well-occasional hip pain.  Discussed in detail.  Active baby.

## 2017-04-02 NOTE — Progress Notes (Signed)
Rob- SOB and tired.

## 2017-04-17 ENCOUNTER — Encounter: Payer: Medicare Other | Admitting: Obstetrics and Gynecology

## 2017-04-17 ENCOUNTER — Ambulatory Visit (INDEPENDENT_AMBULATORY_CARE_PROVIDER_SITE_OTHER): Payer: Medicare Other | Admitting: Obstetrics and Gynecology

## 2017-04-17 VITALS — BP 95/68 | HR 93 | Wt 192.1 lb

## 2017-04-17 DIAGNOSIS — Z3685 Encounter for antenatal screening for Streptococcus B: Secondary | ICD-10-CM

## 2017-04-17 DIAGNOSIS — Z113 Encounter for screening for infections with a predominantly sexual mode of transmission: Secondary | ICD-10-CM

## 2017-04-17 DIAGNOSIS — Z3483 Encounter for supervision of other normal pregnancy, third trimester: Secondary | ICD-10-CM

## 2017-04-17 NOTE — Progress Notes (Signed)
ROB:  No complaints.  GC/CT - GBS done today.

## 2017-04-17 NOTE — Progress Notes (Signed)
ROB pt is doing well no concerns.  

## 2017-04-19 LAB — STREP GP B NAA: STREP GROUP B AG: POSITIVE — AB

## 2017-04-20 LAB — GC/CHLAMYDIA PROBE AMP
Chlamydia trachomatis, NAA: NEGATIVE
Neisseria gonorrhoeae by PCR: NEGATIVE

## 2017-04-25 ENCOUNTER — Encounter: Payer: Self-pay | Admitting: Obstetrics and Gynecology

## 2017-04-25 ENCOUNTER — Ambulatory Visit (INDEPENDENT_AMBULATORY_CARE_PROVIDER_SITE_OTHER): Payer: Medicare Other | Admitting: Obstetrics and Gynecology

## 2017-04-25 ENCOUNTER — Other Ambulatory Visit (INDEPENDENT_AMBULATORY_CARE_PROVIDER_SITE_OTHER): Payer: Medicare Other

## 2017-04-25 VITALS — BP 97/64 | HR 84 | Wt 193.5 lb

## 2017-04-25 DIAGNOSIS — Z3483 Encounter for supervision of other normal pregnancy, third trimester: Secondary | ICD-10-CM | POA: Diagnosis not present

## 2017-04-25 DIAGNOSIS — Z3689 Encounter for other specified antenatal screening: Secondary | ICD-10-CM

## 2017-04-25 DIAGNOSIS — R110 Nausea: Secondary | ICD-10-CM

## 2017-04-25 DIAGNOSIS — O329XX0 Maternal care for malpresentation of fetus, unspecified, not applicable or unspecified: Secondary | ICD-10-CM | POA: Diagnosis not present

## 2017-04-25 LAB — POCT URINALYSIS DIPSTICK
Bilirubin, UA: NEGATIVE
Blood, UA: NEGATIVE
Glucose, UA: NEGATIVE
KETONES UA: NEGATIVE
Leukocytes, UA: NEGATIVE
Nitrite, UA: NEGATIVE
PH UA: 7 (ref 5.0–8.0)
Protein, UA: NEGATIVE
SPEC GRAV UA: 1.01 (ref 1.010–1.025)
UROBILINOGEN UA: 0.2 U/dL

## 2017-04-25 NOTE — Progress Notes (Signed)
ROB: Patient complains of decreased appetite and nausea.  Denies vomiting. Can modify diet and also use of ginger products or Vitamin B6.  Presentation indeterminate today, will get ultrasound to confirm of cephalic. GBS negative. RTC in 1 week.

## 2017-04-25 NOTE — Progress Notes (Signed)
ROB-pt is having symptoms as first trimester with nausea and lost of appetite.

## 2017-05-02 ENCOUNTER — Encounter: Payer: Medicare Other | Admitting: Obstetrics and Gynecology

## 2017-05-02 ENCOUNTER — Ambulatory Visit (INDEPENDENT_AMBULATORY_CARE_PROVIDER_SITE_OTHER): Payer: Medicare Other | Admitting: Obstetrics and Gynecology

## 2017-05-02 ENCOUNTER — Encounter: Payer: Self-pay | Admitting: Obstetrics and Gynecology

## 2017-05-02 VITALS — BP 86/52 | HR 89 | Wt 192.2 lb

## 2017-05-02 DIAGNOSIS — O99013 Anemia complicating pregnancy, third trimester: Secondary | ICD-10-CM

## 2017-05-02 DIAGNOSIS — Z3483 Encounter for supervision of other normal pregnancy, third trimester: Secondary | ICD-10-CM

## 2017-05-02 DIAGNOSIS — O09521 Supervision of elderly multigravida, first trimester: Secondary | ICD-10-CM

## 2017-05-02 DIAGNOSIS — B951 Streptococcus, group B, as the cause of diseases classified elsewhere: Secondary | ICD-10-CM

## 2017-05-02 LAB — POCT URINALYSIS DIPSTICK
Bilirubin, UA: NEGATIVE
Blood, UA: NEGATIVE
Clarity, UA: NEGATIVE
GLUCOSE UA: NEGATIVE
KETONES UA: NEGATIVE
LEUKOCYTES UA: NEGATIVE
NITRITE UA: NEGATIVE
ODOR: NEGATIVE
Spec Grav, UA: 1.015 (ref 1.010–1.025)
Urobilinogen, UA: 0.2 E.U./dL
pH, UA: 6.5 (ref 5.0–8.0)

## 2017-05-02 NOTE — Patient Instructions (Signed)
1.  Return in 1 week for regular OB appointment 

## 2017-05-02 NOTE — Progress Notes (Signed)
ROB- no complaints.  Good fetal movement.  Irregular Braxton Hicks contractions.  GBS positive.  Patient will need antibiotics in labor.  RTC 1 week.

## 2017-05-09 ENCOUNTER — Encounter: Payer: Self-pay | Admitting: Obstetrics and Gynecology

## 2017-05-09 ENCOUNTER — Ambulatory Visit (INDEPENDENT_AMBULATORY_CARE_PROVIDER_SITE_OTHER): Payer: Medicare Other | Admitting: Obstetrics and Gynecology

## 2017-05-09 VITALS — BP 91/56 | HR 90 | Wt 195.2 lb

## 2017-05-09 DIAGNOSIS — Z3483 Encounter for supervision of other normal pregnancy, third trimester: Secondary | ICD-10-CM | POA: Diagnosis not present

## 2017-05-09 LAB — POCT URINALYSIS DIPSTICK
Bilirubin, UA: NEGATIVE
Blood, UA: NEGATIVE
GLUCOSE UA: NEGATIVE
Ketones, UA: NEGATIVE
Nitrite, UA: NEGATIVE
ODOR: NEGATIVE
Protein, UA: NEGATIVE
SPEC GRAV UA: 1.01 (ref 1.010–1.025)
Urobilinogen, UA: 0.2 E.U./dL
pH, UA: 8 (ref 5.0–8.0)

## 2017-05-09 NOTE — Progress Notes (Signed)
ROB- no complaints.  

## 2017-05-09 NOTE — Progress Notes (Signed)
ROB: Patient has been irregular contractions.  Signs and symptoms of labor reviewed.  Possibility of post dates discussed including ultrasound and NST testing.  Schedule induction if not in labor next week.

## 2017-05-14 ENCOUNTER — Inpatient Hospital Stay
Admission: EM | Admit: 2017-05-14 | Discharge: 2017-05-16 | DRG: 807 | Disposition: A | Discharge status: Home / Self Care | Payer: Medicare Other | Source: Ambulatory Visit | Attending: Obstetrics and Gynecology | Admitting: Obstetrics and Gynecology

## 2017-05-14 ENCOUNTER — Ambulatory Visit (INDEPENDENT_AMBULATORY_CARE_PROVIDER_SITE_OTHER): Payer: Medicare Other | Admitting: Obstetrics and Gynecology

## 2017-05-14 ENCOUNTER — Other Ambulatory Visit: Payer: Self-pay

## 2017-05-14 ENCOUNTER — Encounter: Payer: Self-pay | Admitting: Obstetrics and Gynecology

## 2017-05-14 VITALS — BP 95/54 | HR 85 | Wt 196.1 lb

## 2017-05-14 DIAGNOSIS — Z3483 Encounter for supervision of other normal pregnancy, third trimester: Secondary | ICD-10-CM

## 2017-05-14 DIAGNOSIS — O99824 Streptococcus B carrier state complicating childbirth: Secondary | ICD-10-CM | POA: Diagnosis present

## 2017-05-14 DIAGNOSIS — O09521 Supervision of elderly multigravida, first trimester: Secondary | ICD-10-CM

## 2017-05-14 DIAGNOSIS — D649 Anemia, unspecified: Secondary | ICD-10-CM | POA: Diagnosis present

## 2017-05-14 DIAGNOSIS — O480 Post-term pregnancy: Secondary | ICD-10-CM

## 2017-05-14 DIAGNOSIS — G35 Multiple sclerosis: Secondary | ICD-10-CM | POA: Diagnosis present

## 2017-05-14 DIAGNOSIS — O9902 Anemia complicating childbirth: Secondary | ICD-10-CM | POA: Diagnosis present

## 2017-05-14 DIAGNOSIS — B951 Streptococcus, group B, as the cause of diseases classified elsewhere: Secondary | ICD-10-CM

## 2017-05-14 DIAGNOSIS — Z3A39 39 weeks gestation of pregnancy: Secondary | ICD-10-CM

## 2017-05-14 LAB — POCT URINALYSIS DIPSTICK
Appearance: NEGATIVE
Bilirubin, UA: NEGATIVE
Blood, UA: NEGATIVE
Glucose, UA: NEGATIVE
Ketones, UA: 40
NITRITE UA: NEGATIVE
PROTEIN UA: NEGATIVE
Spec Grav, UA: 1.015 (ref 1.010–1.025)
Urobilinogen, UA: 0.2 E.U./dL
pH, UA: 7 (ref 5.0–8.0)

## 2017-05-14 NOTE — Progress Notes (Signed)
Pt pleasant and calm, arrived to BP in WC via ER. Pt reports being seen in OB clinic this afternoon, was seen by Dr Valentino Saxon, had membrane stripped and started having contractions since 1853, coming every 5 mins. Last 60-90 sec since that time, consistently. Pt confirms +FM, denies blood show or leaking fluid. Rates pain during contractions, 7/10. GBS +

## 2017-05-14 NOTE — Progress Notes (Signed)
ROB-pt stated that she having some contractions not sure if they are regular. Pt stated that she is having some pressure and pain in her lower abd.

## 2017-05-14 NOTE — Addendum Note (Signed)
Addended by: Fabian November on: 05/14/2017 05:26 PM   Modules accepted: Orders, SmartSet

## 2017-05-14 NOTE — Progress Notes (Signed)
ROB:  Notes irregular ctx over past several days, have increased somewhat in frequency over the past several hours, now q 10 min.  Given labor precautions. RTC in 1 week if undelivered, will schedule for IOL at 41 weeks (05/23/2017). Will need growth/AFI check and NST next visit.

## 2017-05-15 ENCOUNTER — Inpatient Hospital Stay: Payer: Medicare Other | Admitting: Anesthesiology

## 2017-05-15 DIAGNOSIS — O480 Post-term pregnancy: Secondary | ICD-10-CM | POA: Diagnosis present

## 2017-05-15 DIAGNOSIS — O99824 Streptococcus B carrier state complicating childbirth: Secondary | ICD-10-CM | POA: Diagnosis present

## 2017-05-15 DIAGNOSIS — D649 Anemia, unspecified: Secondary | ICD-10-CM | POA: Diagnosis present

## 2017-05-15 DIAGNOSIS — G35 Multiple sclerosis: Secondary | ICD-10-CM | POA: Diagnosis present

## 2017-05-15 DIAGNOSIS — Z3A39 39 weeks gestation of pregnancy: Secondary | ICD-10-CM | POA: Diagnosis not present

## 2017-05-15 DIAGNOSIS — O9902 Anemia complicating childbirth: Secondary | ICD-10-CM | POA: Diagnosis present

## 2017-05-15 LAB — CBC
HCT: 33.4 % — ABNORMAL LOW (ref 35.0–47.0)
Hemoglobin: 11.3 g/dL — ABNORMAL LOW (ref 12.0–16.0)
MCH: 29.4 pg (ref 26.0–34.0)
MCHC: 33.8 g/dL (ref 32.0–36.0)
MCV: 86.9 fL (ref 80.0–100.0)
PLATELETS: 209 10*3/uL (ref 150–440)
RBC: 3.85 MIL/uL (ref 3.80–5.20)
RDW: 14.3 % (ref 11.5–14.5)
WBC: 10.8 10*3/uL (ref 3.6–11.0)

## 2017-05-15 LAB — TYPE AND SCREEN
ABO/RH(D): A POS
Antibody Screen: NEGATIVE

## 2017-05-15 MED ORDER — ACETAMINOPHEN 325 MG PO TABS
650.0000 mg | ORAL_TABLET | ORAL | Status: DC | PRN
Start: 2017-05-15 — End: 2017-05-17
  Administered 2017-05-16: 650 mg via ORAL
  Filled 2017-05-15: qty 2

## 2017-05-15 MED ORDER — ONDANSETRON HCL 4 MG/2ML IJ SOLN: 4.0000 mg | Freq: Four times a day (QID) | INTRAMUSCULAR | Status: DC | PRN

## 2017-05-15 MED ORDER — SIMETHICONE 80 MG PO CHEW: 80.0000 mg | CHEWABLE_TABLET | ORAL | Status: DC | PRN

## 2017-05-15 MED ORDER — COCONUT OIL OIL
1.0000 "application " | TOPICAL_OIL | Status: DC | PRN
  Filled 2017-05-15: qty 120

## 2017-05-15 MED ORDER — SODIUM CHLORIDE 0.9 % IV SOLN
1.0000 g | INTRAVENOUS | Status: DC
  Administered 2017-05-15 (×3): 1 g via INTRAVENOUS
  Filled 2017-05-15 (×7): qty 1000

## 2017-05-15 MED ORDER — FERROUS SULFATE 325 (65 FE) MG PO TABS
325.0000 mg | ORAL_TABLET | Freq: Two times a day (BID) | ORAL | Status: DC
  Filled 2017-05-15 (×2): qty 1

## 2017-05-15 MED ORDER — MISOPROSTOL 200 MCG PO TABS
ORAL_TABLET | ORAL | Status: AC
  Filled 2017-05-15: qty 4

## 2017-05-15 MED ORDER — LIDOCAINE-EPINEPHRINE (PF) 1.5 %-1:200000 IJ SOLN
INTRAMUSCULAR | Status: DC | PRN
  Administered 2017-05-15: 3 mL via PERINEURAL

## 2017-05-15 MED ORDER — LACTATED RINGERS IV SOLN
500.0000 mL | Freq: Once | INTRAVENOUS | Status: AC
  Administered 2017-05-15: 500 mL via INTRAVENOUS

## 2017-05-15 MED ORDER — LIDOCAINE HCL (PF) 1 % IJ SOLN
30.0000 mL | INTRAMUSCULAR | Status: DC | PRN
  Filled 2017-05-15: qty 30

## 2017-05-15 MED ORDER — MEASLES, MUMPS & RUBELLA VAC ~~LOC~~ INJ
0.5000 mL | INJECTION | Freq: Once | SUBCUTANEOUS | Status: DC
  Filled 2017-05-15: qty 0.5

## 2017-05-15 MED ORDER — DIPHENHYDRAMINE HCL 25 MG PO CAPS: 25.0000 mg | ORAL_CAPSULE | Freq: Four times a day (QID) | ORAL | Status: DC | PRN

## 2017-05-15 MED ORDER — FENTANYL 2.5 MCG/ML W/ROPIVACAINE 0.15% IN NS 100 ML EPIDURAL (ARMC)
EPIDURAL | Status: AC
  Filled 2017-05-15: qty 100

## 2017-05-15 MED ORDER — SODIUM CHLORIDE 0.9 % IV SOLN
2.0000 g | Freq: Once | INTRAVENOUS | Status: AC
  Administered 2017-05-15: 2 g via INTRAVENOUS
  Filled 2017-05-15: qty 2000

## 2017-05-15 MED ORDER — TETANUS-DIPHTH-ACELL PERTUSSIS 5-2.5-18.5 LF-MCG/0.5 IM SUSP: 0.5000 mL | INTRAMUSCULAR | Status: DC | PRN

## 2017-05-15 MED ORDER — AMMONIA AROMATIC IN INHA
RESPIRATORY_TRACT | Status: AC
  Filled 2017-05-15: qty 10

## 2017-05-15 MED ORDER — OXYTOCIN 40 UNITS IN LACTATED RINGERS INFUSION - SIMPLE MED
2.5000 [IU]/h | INTRAVENOUS | Status: DC
  Administered 2017-05-15 (×2): 2.5 [IU]/h via INTRAVENOUS
  Filled 2017-05-15 (×2): qty 1000

## 2017-05-15 MED ORDER — EPHEDRINE 5 MG/ML INJ
10.0000 mg | INTRAVENOUS | Status: DC | PRN
  Filled 2017-05-15: qty 2

## 2017-05-15 MED ORDER — FENTANYL 2.5 MCG/ML W/ROPIVACAINE 0.15% IN NS 100 ML EPIDURAL (ARMC)
12.0000 mL/h | EPIDURAL | Status: DC
  Administered 2017-05-15: 12 mL/h via EPIDURAL

## 2017-05-15 MED ORDER — BUTORPHANOL TARTRATE 1 MG/ML IJ SOLN
1.0000 mg | INTRAMUSCULAR | Status: DC | PRN
  Administered 2017-05-15 (×2): 1 mg via INTRAVENOUS
  Filled 2017-05-15 (×2): qty 1

## 2017-05-15 MED ORDER — LACTATED RINGERS IV SOLN
500.0000 mL | INTRAVENOUS | Status: DC | PRN
  Administered 2017-05-15: 250 mL via INTRAVENOUS

## 2017-05-15 MED ORDER — TERBUTALINE SULFATE 1 MG/ML IJ SOLN: 0.5000 mg | Freq: Once | INTRAMUSCULAR | Status: DC | PRN

## 2017-05-15 MED ORDER — SOD CITRATE-CITRIC ACID 500-334 MG/5ML PO SOLN: 30.0000 mL | ORAL | Status: DC | PRN

## 2017-05-15 MED ORDER — OXYTOCIN 10 UNIT/ML IJ SOLN
INTRAMUSCULAR | Status: AC
  Filled 2017-05-15: qty 2

## 2017-05-15 MED ORDER — LACTATED RINGERS IV SOLN
INTRAVENOUS | Status: DC
  Administered 2017-05-15 (×2): via INTRAVENOUS

## 2017-05-15 MED ORDER — OXYTOCIN BOLUS FROM INFUSION
500.0000 mL | Freq: Once | INTRAVENOUS | Status: AC
  Administered 2017-05-15: 500 mL via INTRAVENOUS

## 2017-05-15 MED ORDER — OXYCODONE-ACETAMINOPHEN 5-325 MG PO TABS: 1.0000 | ORAL_TABLET | ORAL | Status: DC | PRN

## 2017-05-15 MED ORDER — ACETAMINOPHEN 325 MG PO TABS: 650.0000 mg | ORAL_TABLET | ORAL | Status: DC | PRN

## 2017-05-15 MED ORDER — IBUPROFEN 800 MG PO TABS
800.0000 mg | ORAL_TABLET | Freq: Three times a day (TID) | ORAL | Status: DC
  Administered 2017-05-16: 800 mg via ORAL
  Filled 2017-05-15 (×3): qty 1

## 2017-05-15 MED ORDER — SENNOSIDES-DOCUSATE SODIUM 8.6-50 MG PO TABS: 2.0000 | ORAL_TABLET | ORAL | Status: DC

## 2017-05-15 MED ORDER — LACTATED RINGERS IV BOLUS: 1000.0000 mL | Freq: Once | INTRAVENOUS | Status: DC

## 2017-05-15 MED ORDER — OXYTOCIN 40 UNITS IN LACTATED RINGERS INFUSION - SIMPLE MED: 1.0000 m[IU]/min | INTRAVENOUS | Status: DC

## 2017-05-15 MED ORDER — ONDANSETRON HCL 4 MG PO TABS: 4.0000 mg | ORAL_TABLET | ORAL | Status: DC | PRN

## 2017-05-15 MED ORDER — OXYCODONE-ACETAMINOPHEN 5-325 MG PO TABS: 2.0000 | ORAL_TABLET | ORAL | Status: DC | PRN

## 2017-05-15 MED ORDER — DIPHENHYDRAMINE HCL 50 MG/ML IJ SOLN: 12.5000 mg | INTRAMUSCULAR | Status: DC | PRN

## 2017-05-15 MED ORDER — DIBUCAINE 1 % RE OINT: 1.0000 "application " | TOPICAL_OINTMENT | RECTAL | Status: DC | PRN

## 2017-05-15 MED ORDER — LIDOCAINE HCL (PF) 1 % IJ SOLN
INTRAMUSCULAR | Status: DC | PRN
  Administered 2017-05-15: 3 mL via INTRADERMAL

## 2017-05-15 MED ORDER — OXYTOCIN 40 UNITS IN LACTATED RINGERS INFUSION - SIMPLE MED
1.0000 m[IU]/min | INTRAVENOUS | Status: DC
  Administered 2017-05-15: 1 m[IU]/min via INTRAVENOUS

## 2017-05-15 MED ORDER — PHENYLEPHRINE 40 MCG/ML (10ML) SYRINGE FOR IV PUSH (FOR BLOOD PRESSURE SUPPORT)
80.0000 ug | PREFILLED_SYRINGE | INTRAVENOUS | Status: DC | PRN
  Filled 2017-05-15: qty 5

## 2017-05-15 MED ORDER — PRENATAL MULTIVITAMIN CH
1.0000 | ORAL_TABLET | Freq: Every day | ORAL | Status: DC
  Administered 2017-05-16: 1 via ORAL
  Filled 2017-05-15: qty 1

## 2017-05-15 MED ORDER — BENZOCAINE-MENTHOL 20-0.5 % EX AERO: 1.0000 "application " | INHALATION_SPRAY | CUTANEOUS | Status: DC | PRN

## 2017-05-15 MED ORDER — BUPIVACAINE HCL (PF) 0.25 % IJ SOLN
INTRAMUSCULAR | Status: DC | PRN
  Administered 2017-05-15: 5 mL via EPIDURAL

## 2017-05-15 MED ORDER — ONDANSETRON HCL 4 MG/2ML IJ SOLN: 4.0000 mg | INTRAMUSCULAR | Status: DC | PRN

## 2017-05-15 MED ORDER — WITCH HAZEL-GLYCERIN EX PADS
1.0000 | MEDICATED_PAD | CUTANEOUS | Status: DC | PRN
Start: 2017-05-15 — End: 2017-05-17

## 2017-05-15 NOTE — H&P (Signed)
Obstetric History and Physical  Monica Wilkins is a 37 y.o. [id number] with IUP at [redacted]w[redacted]d presenting for complaints of contractions. Patient states she has been having  irregular, every 5-9 minutes contractions, none vaginal bleeding, intact membranes, with active fetal movement.    Prenatal Course Source of Care: [facility] with onset of care at 11 weeks Pregnancy complications or risks: Patient Active Problem List   Diagnosis Date Noted  . Post-dates pregnancy 05/15/2017  . Positive GBS test 05/02/2017  . Advanced maternal age in multigravida, first trimester 05/02/2017  . Anemia of pregnancy in third trimester 03/19/2017  . Supervision of normal pregnancy 02/12/2017   She plans to breastfeed She desires oral contraceptives (estrogen/progesterone) for postpartum contraception.   Prenatal labs and studies: ABO, Rh: --/--/A POS (05/15 0540) Antibody: NEG (05/15 0540) Rubella: 2.37 (10/24 1410) RPR: Non Reactive (02/26 1019)  HBsAg: Negative (10/24 1410)  HIV: Non Reactive (10/24 1410)  VQQ:VZDGLOVF (04/17 1406) 1 hr Glucola  normal Genetic screening normal Anatomy US normal   Past Medical History:  Diagnosis Date  . Anemia   . MS (multiple sclerosis) (HCC)     Past Surgical History:  Procedure Laterality Date  . WISDOM TOOTH EXTRACTION      OB History  Gravida Para Term Preterm AB Living  3 2 2     2   SAB TAB Ectopic Multiple Live Births          2    # Outcome Date GA Lbr Len/2nd Weight Sex Delivery Anes PTL Lv  3 Current           2 Term 2012 [redacted]w[redacted]d  8 lb 9 oz (3.884 kg) M Vag-Spont   LIV  1 Term 2005 106w0d  8 lb 8 oz (3.856 kg) F Vag-Spont   LIV    Social History   Socioeconomic History  . Marital status: Single    Spouse name: Alunza  . Number of children: 2  . Years of education: Not on file  . Highest education level: Not on file  Occupational History  . Not on file  Social Needs  . Financial resource strain: Not very hard  . Food  insecurity:    Worry: Never true    Inability: Never true  . Transportation needs:    Medical: No    Non-medical: No  Tobacco Use  . Smoking status: Never Smoker  . Smokeless tobacco: Never Used  Substance and Sexual Activity  . Alcohol use: No  . Drug use: No  . Sexual activity: Yes    Birth control/protection: None  Lifestyle  . Physical activity:    Days per week: 7 days    Minutes per session: 30 min  . Stress: Not at all  Relationships  . Social connections:    Talks on phone: More than three times a week    Gets together: More than three times a week    Attends religious service: Not on file    Active member of club or organization: Patient refused    Attends meetings of clubs or organizations: Never    Relationship status: Married  Other Topics Concern  . Not on file  Social History Narrative  . Not on file    No family history on file.  Facility-Administered Medications Prior to Admission  Medication Dose Route Frequency Provider Last Rate Last Dose  . Tdap (BOOSTRIX) injection 0.5 mL  0.5 mL Intramuscular Once Linzie Collin, MD  Medications Prior to Admission  Medication Sig Dispense Refill Last Dose  . cholecalciferol (VITAMIN D) 1000 units tablet Take 1,000 Units by mouth daily.   05/14/2017 at Unknown time  . docusate sodium (COLACE) 50 MG capsule Take 1 capsule (50 mg total) by mouth 2 (two) times daily. 10 capsule 0 05/14/2017 at Unknown time  . ferrous sulfate 325 (65 FE) MG tablet Take 1 tablet (325 mg total) by mouth daily with breakfast. 30 tablet 3 05/14/2017 at Unknown time  . fexofenadine (ALLEGRA) 60 MG tablet TAKE 1 TABLET BY MOUTH TWICE A DAY FOR ALLERGIES  1 Past Week at Unknown time  . Prenat-FeFum-FePo-FA-Omega 3 (CONCEPT DHA) 53.5-38-1 MG CAPS Take 1 capsule by mouth daily. 30 capsule 10 05/14/2017 at Unknown time    No Known Allergies  Review of Systems: Negative except for what is mentioned in HPI.  Physical Exam: BP (!) 91/50  (BP Location: Left Arm)   Pulse 82   Temp 98 F (36.7 C) (Oral)   Resp 19   Ht 5\' 6"  (1.676 m)   Wt 196 lb 1.6 oz (89 kg)   LMP 08/01/2016 (Approximate)   BMI 31.65 kg/m  CONSTITUTIONAL: Well-developed, well-nourished female in no acute distress.  HENT:  Normocephalic, atraumatic, External right and left ear normal. Oropharynx is clear and moist EYES: Conjunctivae and EOM are normal. Pupils are equal, round, and reactive to light. No scleral icterus.  NECK: Normal range of motion, supple, no masses SKIN: Skin is warm and dry. No rash noted. Not diaphoretic. No erythema. No pallor. NEUROLOGIC: Alert and oriented to person, place, and time. Normal reflexes, muscle tone coordination. No cranial nerve deficit noted. PSYCHIATRIC: Normal mood and affect. Normal behavior. Normal judgment and thought content. CARDIOVASCULAR: Normal heart rate noted, regular rhythm RESPIRATORY: Effort and breath sounds normal, no problems with respiration noted ABDOMEN: Soft, nontender, nondistended, gravid. MUSCULOSKELETAL: Normal range of motion. No edema and no tenderness. 2+ distal pulses.  Cervical Exam: Dilatation 3 cm   Effacement 70%   Station -2   Presentation: cephalic FHT:  Baseline rate 145 bpm   Variability moderate  Accelerations present   Decelerations none Contractions: Every 2-5 mins, irregular   Pertinent Labs/Studies:   Results for orders placed or performed during the hospital encounter of 05/14/17 (from the past 24 hour(s))  Type and screen     Status: None (Preliminary result)   Collection Time: 05/15/17  2:29 AM  Result Value Ref Range   ABO/RH(D) PENDING    Antibody Screen PENDING    Sample Expiration      05/18/2017 Performed at [facility], [address] [place], [place] [address]   CBC     Status: Abnormal   Collection Time: 05/15/17  2:30 AM  Result Value Ref Range   WBC 10.8 3.6 - 11.0 K/uL   RBC 3.85 3.80 - 5.20 MIL/uL   Hemoglobin 11.3 (L) 12.0 - 16.0 g/dL    HCT 28.7 (L) 68.1 - 47.0 %   MCV 86.9 80.0 - 100.0 fL   MCH 29.4 26.0 - 34.0 pg   MCHC 33.8 32.0 - 36.0 g/dL   RDW 15.7 26.2 - 03.5 %   Platelets 209 150 - 440 K/uL  Type and screen     Status: None   Collection Time: 05/15/17  5:40 AM  Result Value Ref Range   ABO/RH(D) A POS    Antibody Screen NEG    Sample Expiration      05/18/2017 Performed at  [facility], [address], [place], [place] [address]     Assessment : Monica Wilkins is a 37 y.o. G3P2002 at [redacted]w[redacted]d being admitted for latent labor, h/o rapid labor in prior pregnancy.   Plan: Labor: Expectant management. Augmentation as indicated as per protocol. Analgesia as needed. FWB: Reassuring fetal heart tracing.  GBS negative Delivery plan: Hopeful for vaginal delivery   Hildred Laser, MD [facility]

## 2017-05-15 NOTE — Progress Notes (Signed)
Pt returned to room after ambulating for about 2 hrs, EFM applied, reports contractions still coming and feels closer, more pressure and pain since walking.

## 2017-05-15 NOTE — Progress Notes (Signed)
OB GYN PROGRESS NOTE: SUBJECTIVE: Called to see patient for recurrent variable decelerations/late decelerations.  External monitors are not adequately picking up tracings. Contractions are irregular. Epidural is functional. Amniotic fluid is clear.  OBJECTIVE: BP 105/66   Pulse 82   Temp 97.9 F (36.6 C) (Oral)   Resp 19   Ht 5\' 6"  (1.676 m)   Wt 196 lb 1.6 oz (89 kg)   LMP 08/01/2016 (Approximate)   SpO2 100%   BMI 31.65 kg/m  Comfortable gravid female in no acute distress.  Epidural functional. Cervix: 7/90/0/vertex/FSE/IUPC placed  Fetal heart rate tracing: Moderate variable decelerations noted.  Good beat-to-beat variability.  Contractions infrequent, suboptimal  ASSESSMENT: 1.  Moderate variable decelerations with good beat-to-beat variability 2.  Clear fluid 3.  Suspect cord pattern in fetal heart rate tracing 4.  History of rapid deliveries  PLAN: 1.  FSE/APC as noted 2.  Close observation 3.  Consider amnioinfusion if variables persist  Herold Harms, MD  Note: This dictation was prepared with Dragon dictation along with smaller phrase technology. Any transcriptional errors that result from this process are unintentional.

## 2017-05-15 NOTE — Progress Notes (Signed)
Patient ID: KLA BILY, female   DOB: Nov 01, 1980, 37 y.o.   MRN: [id number]  LABOR NOTE   Monica Wilkins 37 y.o.GP@ at [redacted]w[redacted]d Early latent labor.  SUBJECTIVE:  Pt having irretg ctx.  Breathing through them. OBJECTIVE:  BP (!) 91/50 (BP Location: Left Arm)   Pulse 82   Temp 98 F (36.7 C) (Oral)   Resp 19   Ht 5\' 6"  (1.676 m)   Wt 196 lb 1.6 oz (89 kg)   LMP 08/01/2016 (Approximate)   BMI 31.65 kg/m  No intake/output data recorded.  She has shown cervical change. CERVIX: 4:  75%:   -2:   SVE:   Dilation: 4 Effacement (%): 70 Station: -2 Exam by:: Dr.Evans   FHR: Fetal heart tracing reviewed. Variability: Good {> 6 bpm) Category I   Analgesia: Labor support without medications  Labs: Lab Results  Component Value Date   WBC 10.8 05/15/2017   HGB 11.3 (L) 05/15/2017   HCT 33.4 (L) 05/15/2017   MCV 86.9 05/15/2017   PLT 209 05/15/2017   AROM - clear fluid.  ASSESSMENT: 1) Labor curve reviewed.       Progress: Early latent labor.     Membranes: ruptured, clear fluid      2)  AROM  Active Problems:   Post-dates pregnancy   PLAN: expectant management  Elonda Husky, M.D. 05/15/2017 8:02 AM

## 2017-05-15 NOTE — Anesthesia Preprocedure Evaluation (Signed)
Anesthesia Evaluation  Patient identified by MRN, date of birth, ID band Patient awake    Reviewed: Allergy & Precautions, H&P , NPO status , Patient's Chart, lab work & pertinent test results  History of Anesthesia Complications Negative for: history of anesthetic complications  Airway Mallampati: II  TM Distance: <3 FB Neck ROM: full  Mouth opening: Limited Mouth Opening  Dental no notable dental hx.    Pulmonary neg pulmonary ROS,    Pulmonary exam normal        Cardiovascular Exercise Tolerance: Good negative cardio ROS Normal cardiovascular exam     Neuro/Psych MS negative psych ROS   GI/Hepatic Neg liver ROS, GERD  Controlled,  Endo/Other  negative endocrine ROS  Renal/GU negative Renal ROS  negative genitourinary   Musculoskeletal   Abdominal   Peds  Hematology  (+) anemia ,   Anesthesia Other Findings MS symptoms were tingling in hands and feet. Stopped her MS medication once pregnant. States symptoms actually better with pregnancy.  Reproductive/Obstetrics (+) Pregnancy                             Anesthesia Physical Anesthesia Plan  ASA: III  Anesthesia Plan: Epidural   Post-op Pain Management:    Induction:   PONV Risk Score and Plan:   Airway Management Planned:   Additional Equipment:   Intra-op Plan:   Post-operative Plan:   Informed Consent: I have reviewed the patients History and Physical, chart, labs and discussed the procedure including the risks, benefits and alternatives for the proposed anesthesia with the patient or authorized representative who has indicated his/her understanding and acceptance.   Dental Advisory Given  Plan Discussed with: Anesthesiologist  Anesthesia Plan Comments:         Anesthesia Quick Evaluation

## 2017-05-15 NOTE — Progress Notes (Signed)
EFM removed, pt agrees with plan to ambulate and then RN will repeat cervical exam at 1:30a to det change.

## 2017-05-15 NOTE — Progress Notes (Addendum)
2328 - Spoke with Dr Valentino Saxon, explained pt in triage for r/o labor. Having painful contractions. Occasional early and variable decel noted with some contractions shortly after EFM applied, improved with position change, ctx 5-9 mins apart, GBS pos. Scheduled for IOL 5/23. Per Dr Valentino Saxon, have pt ambulate and repeat exam at 1:30a.

## 2017-05-15 NOTE — Anesthesia Procedure Notes (Signed)
Epidural Patient location during procedure: OB Start time: 05/15/2017 12:57 PM End time: 05/15/2017 1:04 PM  Staffing Anesthesiologist: Piscitello, Cleda Mccreedy, MD Resident/CRNA: Ginger Carne, CRNA Performed: resident/CRNA   Preanesthetic Checklist Completed: patient identified, site marked, surgical consent, pre-op evaluation, timeout performed, IV checked, risks and benefits discussed and monitors and equipment checked  Epidural Patient position: sitting Prep: ChloraPrep Patient monitoring: heart rate, continuous pulse ox and blood pressure Approach: midline Location: L3-L4 Injection technique: LOR saline  Needle:  Needle type: Tuohy  Needle gauge: 17 G Needle length: 9 cm and 9 Needle insertion depth: 8 cm Catheter type: closed end flexible Catheter size: 19 Gauge Catheter at skin depth: 13 cm Test dose: negative and 1.5% lidocaine with Epi 1:200 K  Assessment Sensory level: T10 Events: blood not aspirated, injection not painful, no injection resistance, negative IV test and no paresthesia  Additional Notes 1 attempt Pt. Evaluated and documentation done after procedure finished. Patient identified. Risks/Benefits/Options discussed with patient including but not limited to bleeding, infection, nerve damage, paralysis, failed block, incomplete pain control, headache, blood pressure changes, nausea, vomiting, reactions to medication both or allergic, itching and postpartum back pain. Confirmed with bedside nurse the patient's most recent platelet count. Confirmed with patient that they are not currently taking any anticoagulation, have any bleeding history or any family history of bleeding disorders. Patient expressed understanding and wished to proceed. All questions were answered. Sterile technique was used throughout the entire procedure. Please see nursing notes for vital signs. Test dose was given through epidural catheter and negative prior to continuing to dose epidural  or start infusion. Warning signs of high block given to the patient including shortness of breath, tingling/numbness in hands, complete motor block, or any concerning symptoms with instructions to call for help. Patient was given instructions on fall risk and not to get out of bed. All questions and concerns addressed with instructions to call with any issues or inadequate analgesia.   Patient tolerated the insertion well without immediate complications.Reason for block:procedure for pain

## 2017-05-16 DIAGNOSIS — Z3A39 39 weeks gestation of pregnancy: Secondary | ICD-10-CM

## 2017-05-16 DIAGNOSIS — O480 Post-term pregnancy: Principal | ICD-10-CM

## 2017-05-16 LAB — RPR: RPR Ser Ql: NONREACTIVE

## 2017-05-16 LAB — CBC
HCT: 32.7 % — ABNORMAL LOW (ref 35.0–47.0)
Hemoglobin: 10.9 g/dL — ABNORMAL LOW (ref 12.0–16.0)
MCH: 28.8 pg (ref 26.0–34.0)
MCHC: 33.3 g/dL (ref 32.0–36.0)
MCV: 86.5 fL (ref 80.0–100.0)
PLATELETS: 198 10*3/uL (ref 150–440)
RBC: 3.78 MIL/uL — ABNORMAL LOW (ref 3.80–5.20)
RDW: 14.1 % (ref 11.5–14.5)
WBC: 13.1 10*3/uL — AB (ref 3.6–11.0)

## 2017-05-16 MED ORDER — IBUPROFEN 800 MG PO TABS: 800.0000 mg | ORAL_TABLET | Freq: Three times a day (TID) | ORAL | 0 refills | Status: DC

## 2017-05-16 NOTE — Discharge Summary (Signed)
Physician Obstetric Discharge Summary  Patient ID: Monica Wilkins MRN: [id number] DOB/AGE: Apr 18, 1980 37 y.o.   Date of Admission: 05/14/2017  Date of Discharge: 05/16/2017  Admitting Diagnosis: Onset of Labor at [redacted]w[redacted]d  Secondary Diagnosis: Anemia in pregnancy and GBS positive; AMA  Mode of Delivery: normal spontaneous vaginal delivery     Discharge Diagnosis: 1.  39.6-week intrauterine pregnancy, delivered 2.  Viable female 3.  Nuchal cord x1, type, reduced4.  True knot in cord5.  GBS positive6.  AMA7.  A positive blood type   Intrapartum Procedures: GBS prophylaxis, pitocin augmentation, placement of fetal scalp electrode, placement of intrauterine catheter and Amnioinfusion   Post partum procedures: None  Complications: none   Brief Hospital Course  Monica Wilkins is a [id number] who had a SVD on 05/15/2017;  for further details of this surgery, please refer to the delivey note.  Patient had an uncomplicated postpartum course.  By time of discharge on PPD#1, her pain was controlled on oral pain medications; she had appropriate lochia and was ambulating, voiding without difficulty and tolerating regular diet.  She was deemed stable for discharge to home.     Labs: CBC Latest Ref Rng & Units 05/16/2017 05/15/2017 02/26/2017  WBC 3.6 - 11.0 K/uL 13.1(H) 10.8 9.4  Hemoglobin 12.0 - 16.0 g/dL 10.9(L) 11.3(L) 9.7(L)  Hematocrit 35.0 - 47.0 % 32.7(L) 33.4(L) 30.0(L)  Platelets 150 - 440 K/uL 198 209 233   A POS  Physical exam:  Blood pressure 107/80, pulse 72, temperature 98.7 F (37.1 C), temperature source Oral, resp. rate 20, height 5\' 6"  (1.676 m), weight 196 lb 1.6 oz (89 kg), last menstrual period 08/01/2016, SpO2 99 %, unknown if currently breastfeeding. General: alert and no distress Lochia: appropriate Abdomen: soft, NT Uterine Fundus: firm Extremities: No evidence of DVT seen on physical exam. No lower extremity edema.  Discharge Instructions: Per After Visit  Summary. Activity: Advance as tolerated. Pelvic rest for 6 weeks.  Also refer to After Visit Summary Diet: Regular Medications: Allergies as of 05/16/2017   No Known Allergies     Medication List    STOP taking these medications   docusate sodium 50 MG capsule Commonly known as:  COLACE     TAKE these medications   cholecalciferol 1000 units tablet Commonly known as:  VITAMIN D Take 1,000 Units by mouth daily.   CONCEPT DHA 53.5-38-1 MG Caps Take 1 capsule by mouth daily.   ferrous sulfate 325 (65 FE) MG tablet Take 1 tablet (325 mg total) by mouth daily with breakfast.   fexofenadine 60 MG tablet Commonly known as:  ALLEGRA TAKE 1 TABLET BY MOUTH TWICE A DAY FOR ALLERGIES   ibuprofen 800 MG tablet Commonly known as:  ADVIL,MOTRIN Take 1 tablet (800 mg total) by mouth 3 (three) times daily.      Outpatient follow up:  Follow-up Information    Holland Nickson, Prentice Docker, MD. Schedule an appointment as soon as possible for a visit in 6 week(s).   Specialties:  Obstetrics and Gynecology, Radiology Contact information: [address] [place] [address] [phone]          Postpartum contraception: condoms, oral progesterone-only contraceptive  Discharged Condition: good  Discharged to: home   Newborn Data: Disposition:home with mother  Apgars: APGAR (1 MIN): 8   APGAR (5 MINS): 9   APGAR (10 MINS):    Baby Feeding: Breast  Herold Harms, MD

## 2017-05-16 NOTE — Progress Notes (Signed)
05/16/2017 9:23 PM  BP 117/71 (BP Location: Right Arm)   Pulse 75   Temp 98 F (36.7 C) (Oral)   Resp 18   Ht 5\' 6"  (167.6 cm)   Wt 196 lb 1.6 oz (88950 g)   LMP 08/01/2016 (Approximate)   SpO2 99%   Breastfeeding? Unknown   BMI 31.65 kg/m  Patient discharged per MD orders. Discharge instructions reviewed with patient and patient verbalized understanding. Prescriptions discussed with patient. Discharged via wheelchair escorted by family and nursing staff with infant in car seat.  Ron Parker, RN

## 2017-05-17 NOTE — Anesthesia Postprocedure Evaluation (Signed)
Anesthesia Post Note  Patient: Monica Wilkins  Procedure(s) Performed: AN AD HOC LABOR EPIDURAL  Anesthesia Type: Epidural Comments: PATIENT DISCHARGED PRIOR TO ANESTHESIA RE-EVAULATION     Last Vitals:  Vitals:   05/16/17 0738 05/16/17 1126  BP: 107/80 117/71  Pulse: 72 75  Resp: 20 18  Temp: 37.1 C 36.7 C  SpO2: 99%     Last Pain:  Vitals:   05/16/17 1605  TempSrc:   PainSc: 0-No pain                 Monica Wilkins

## 2017-05-21 ENCOUNTER — Other Ambulatory Visit: Payer: Medicare Other

## 2017-05-21 ENCOUNTER — Encounter: Payer: Medicare Other | Admitting: Obstetrics and Gynecology

## 2017-05-27 ENCOUNTER — Other Ambulatory Visit: Payer: Self-pay | Admitting: Obstetrics and Gynecology

## 2017-06-26 ENCOUNTER — Ambulatory Visit (INDEPENDENT_AMBULATORY_CARE_PROVIDER_SITE_OTHER): Payer: Medicare Other | Admitting: Obstetrics and Gynecology

## 2017-06-26 ENCOUNTER — Encounter: Payer: Self-pay | Admitting: Obstetrics and Gynecology

## 2017-06-26 DIAGNOSIS — Z3042 Encounter for surveillance of injectable contraceptive: Secondary | ICD-10-CM | POA: Diagnosis not present

## 2017-06-26 MED ORDER — MEDROXYPROGESTERONE ACETATE 150 MG/ML IM SUSP: 150.0000 mg | INTRAMUSCULAR | 1 refills | Status: DC

## 2017-06-26 MED ORDER — MEDROXYPROGESTERONE ACETATE 150 MG/ML IM SUSP
150.0000 mg | Freq: Once | INTRAMUSCULAR | Status: AC
  Administered 2017-06-26: 150 mg via INTRAMUSCULAR

## 2017-06-26 NOTE — Progress Notes (Signed)
Chief complaint: 1.  6-week postpartum check 2.  Status post SVD  Prenatal issues: 1.  Anemia 2.  GBS positive 3.  Advanced maternal age  Patient is status post normal spontaneous vaginal delivery at 39.[redacted] weeks gestation.  Viable female infant; nuchal cord x1, reduced; Apgars 8/9.  True knot in cord.  A+ blood type. Labor was notable for GBS prophylaxis, Pitocin augmentation of labor, FSE and IUPC use along with amnioinfusion due to variable decelerations. Postpartum course was uneventful. Patient is breast-feeding. Contraception desired-Depo-Provera PHQ-9 screening questionnaire for depression is negative-0   Past history, past surgeries, problem list, medications, and allergies are reviewed  OBJECTIVE: BP 121/74   Pulse 86   Ht 5\' 6"  (1.676 m)   Wt 169 lb (76.7 kg)   Breastfeeding? Yes   BMI 27.28 kg/m  Pleasant well-appearing female no acute distress.  Alert and oriented.  Affect is appropriate. Neck: No thyromegaly or adenopathy Lungs: Clear Heart: Regular rate and rhythm without murmur Breasts: Lactation changes present; no evidence of mastitis; nipples are healthy Abdomen: Soft, nontender without organomegaly Pelvic exam: External genitalia-normal BUS-normal Vagina-normal Cervix-minimal lochia at office; no cervical motion tenderness Uterus-midplane, normal size and shape, mobile, nontender Adnexa-nonpalpable nontender Rectovaginal-normal external exam; there is moderate firm stool in rectal vault Extremities-warm and dry  ASSESSMENT: 1.  Status post SVD 2.  Normal 6-week postpartum check 3.  Breast-feeding 4.  Desires Depo-Provera for contraception 5.  PHQ-9 screening questionnaire for depression is negative  PLAN: 1.  Continue with prenatal vitamins while breast-feeding 2.  Depo-Provera 150 mg IM q. 3 months for contraception 3.  Return in 6 months for annual exam  Herold Harms, MD  Note: This dictation was prepared with Dragon dictation along  with smaller phrase technology. Any transcriptional errors that result from this process are unintentional.

## 2017-06-26 NOTE — Patient Instructions (Signed)
1.  Resume all activities without restriction 2.  Continue prenatal vitamins daily 3.  Depo-Provera 150 mg IM every 3 months for contraception 4.  Return in 6 months for annual exam

## 2017-06-26 NOTE — Addendum Note (Signed)
Addended by: Marchelle Folks on: 06/26/2017 01:52 PM   Modules accepted: Orders

## 2017-07-06 ENCOUNTER — Other Ambulatory Visit: Payer: Self-pay | Admitting: Obstetrics and Gynecology

## 2017-07-24 ENCOUNTER — Other Ambulatory Visit: Payer: Self-pay | Admitting: Obstetrics and Gynecology

## 2017-09-02 ENCOUNTER — Other Ambulatory Visit: Payer: Self-pay | Admitting: Obstetrics and Gynecology

## 2017-09-12 ENCOUNTER — Ambulatory Visit (INDEPENDENT_AMBULATORY_CARE_PROVIDER_SITE_OTHER): Payer: Medicare Other | Admitting: Obstetrics and Gynecology

## 2017-09-12 VITALS — BP 121/73 | HR 82 | Ht 66.0 in | Wt 163.5 lb

## 2017-09-12 DIAGNOSIS — Z3042 Encounter for surveillance of injectable contraceptive: Secondary | ICD-10-CM

## 2017-09-12 MED ORDER — MEDROXYPROGESTERONE ACETATE 150 MG/ML IM SUSP
150.0000 mg | Freq: Once | INTRAMUSCULAR | Status: AC
  Administered 2017-09-12: 150 mg via INTRAMUSCULAR

## 2017-09-12 NOTE — Progress Notes (Signed)
Date last pap: 06/26/17. Last Depo-Provera: 06/26/17 Side Effects if any: none. Serum HCG indicated? none. Depo-Provera 150 mg IM given by: FH. Next appointment due Nov 28-Dec 12, 2017.   BP 121/73   Pulse 82   Ht 5\' 6"  (1.676 m)   Wt 163 lb 8 oz (74.2 kg)   BMI 26.39 kg/m

## 2017-09-13 NOTE — Progress Notes (Signed)
I have reviewed the record and concur with patient management and plan.  Holley Kocurek, MD [facility]     

## 2017-09-16 ENCOUNTER — Ambulatory Visit: Payer: Medicare Other

## 2017-10-11 ENCOUNTER — Other Ambulatory Visit (HOSPITAL_COMMUNITY): Payer: Self-pay | Admitting: Neurology

## 2017-10-11 ENCOUNTER — Other Ambulatory Visit: Payer: Self-pay | Admitting: Neurology

## 2017-10-11 DIAGNOSIS — G35 Multiple sclerosis: Secondary | ICD-10-CM

## 2017-10-29 ENCOUNTER — Ambulatory Visit
Admission: RE | Admit: 2017-10-29 | Discharge: 2017-10-29 | Disposition: A | Discharge status: Home / Self Care | Payer: Medicare Other | Source: Ambulatory Visit | Attending: Neurology | Admitting: Neurology

## 2017-10-29 ENCOUNTER — Encounter: Payer: Self-pay | Admitting: Radiology

## 2017-10-29 DIAGNOSIS — G35 Multiple sclerosis: Secondary | ICD-10-CM

## 2017-10-29 DIAGNOSIS — G939 Disorder of brain, unspecified: Secondary | ICD-10-CM | POA: Insufficient documentation

## 2017-10-29 MED ORDER — GADOBUTROL 1 MMOL/ML IV SOLN
8.0000 mL | Freq: Once | INTRAVENOUS | Status: AC | PRN
  Administered 2017-10-29: 8 mL via INTRAVENOUS

## 2017-11-01 ENCOUNTER — Other Ambulatory Visit: Payer: Self-pay | Admitting: Obstetrics and Gynecology

## 2017-11-20 ENCOUNTER — Other Ambulatory Visit: Payer: Self-pay | Admitting: Obstetrics and Gynecology

## 2017-12-02 ENCOUNTER — Telehealth: Payer: Self-pay | Admitting: Obstetrics and Gynecology

## 2017-12-02 ENCOUNTER — Ambulatory Visit (INDEPENDENT_AMBULATORY_CARE_PROVIDER_SITE_OTHER): Payer: Medicare Other | Admitting: Obstetrics and Gynecology

## 2017-12-02 ENCOUNTER — Other Ambulatory Visit: Payer: Self-pay | Admitting: Obstetrics and Gynecology

## 2017-12-02 VITALS — BP 128/85 | HR 90 | Ht 66.0 in | Wt 155.0 lb

## 2017-12-02 DIAGNOSIS — Z3042 Encounter for surveillance of injectable contraceptive: Secondary | ICD-10-CM

## 2017-12-02 MED ORDER — MEDROXYPROGESTERONE ACETATE 150 MG/ML IM SUSP
150.0000 mg | Freq: Once | INTRAMUSCULAR | Status: AC
  Administered 2017-12-02: 150 mg via INTRAMUSCULAR

## 2017-12-02 NOTE — Progress Notes (Signed)
Date last pap: 06/26/17 Last Depo-Provera: 09/12/17 Side Effects if any: NA Serum HCG indicated? NA Depo-Provera 150 mg IM given by: AS, CMA Next appointment due 02/17/18-03/04/18

## 2017-12-02 NOTE — Telephone Encounter (Signed)
Refilled pt's medication and sent it to the pharmacy.

## 2017-12-02 NOTE — Telephone Encounter (Signed)
The patients pharmacy is requesting a new prescription of her Depo injection, The patient needs the script prior to her apt that is today at 1:30 pm. The patient is hoping this can be done soon. Please advise.

## 2017-12-12 ENCOUNTER — Encounter: Payer: Self-pay | Admitting: Obstetrics and Gynecology

## 2018-01-21 ENCOUNTER — Other Ambulatory Visit: Payer: Self-pay | Admitting: Obstetrics and Gynecology

## 2018-02-17 ENCOUNTER — Ambulatory Visit (INDEPENDENT_AMBULATORY_CARE_PROVIDER_SITE_OTHER): Payer: Medicare Other | Admitting: Obstetrics and Gynecology

## 2018-02-17 VITALS — BP 119/73 | HR 78 | Ht 66.0 in | Wt 155.2 lb

## 2018-02-17 DIAGNOSIS — Z3042 Encounter for surveillance of injectable contraceptive: Secondary | ICD-10-CM

## 2018-02-17 MED ORDER — MEDROXYPROGESTERONE ACETATE 150 MG/ML IM SUSP
150.0000 mg | Freq: Once | INTRAMUSCULAR | Status: AC
  Administered 2018-02-17: 150 mg via INTRAMUSCULAR

## 2018-02-17 NOTE — Progress Notes (Signed)
Date last pap: na. Last Depo-Provera: 12/02/17 Side Effects if any: na Serum HCG indicated? na Depo-Provera 150 mg IM given by: Rosine Beat, CMA Next appointment due May 5-May 20, 2018 BP 119/73   Pulse 78   Ht 5\' 6"  (1.676 m)   Wt 155 lb 3.2 oz (70.4 kg)   BMI 25.05 kg/m '

## 2018-02-21 NOTE — Progress Notes (Signed)
I have reviewed the record and concur with patient delivery summary as documented.   Raylee Adamec, MD [facility]  

## 2018-04-15 ENCOUNTER — Other Ambulatory Visit: Payer: Self-pay | Admitting: Neurology

## 2018-04-15 DIAGNOSIS — G35 Multiple sclerosis: Secondary | ICD-10-CM

## 2018-04-16 ENCOUNTER — Other Ambulatory Visit: Payer: Self-pay

## 2018-04-16 ENCOUNTER — Ambulatory Visit
Admission: RE | Admit: 2018-04-16 | Discharge: 2018-04-16 | Disposition: A | Discharge status: Home / Self Care | Payer: Medicare Other | Source: Ambulatory Visit | Attending: Neurology | Admitting: Neurology

## 2018-04-16 DIAGNOSIS — G35 Multiple sclerosis: Secondary | ICD-10-CM | POA: Diagnosis present

## 2018-04-16 MED ORDER — GADOBUTROL 1 MMOL/ML IV SOLN
7.0000 mL | Freq: Once | INTRAVENOUS | Status: AC | PRN
  Administered 2018-04-16: 17:00:00 7 mL via INTRAVENOUS

## 2018-05-05 NOTE — Progress Notes (Cosign Needed)
Date last pap: NA Last Depo-Provera:02/17/2018. Side Effects if any: None . Serum HCG indicated? NA. Depo-Provera 150 mg IM given by: Trixie Deis CMA Next appointment due 07/22/2018-08/05/2018

## 2018-05-06 ENCOUNTER — Telehealth: Payer: Self-pay

## 2018-05-06 NOTE — Telephone Encounter (Signed)
Coronavirus (COVID-19) Are you at risk?  Are you at risk for the Coronavirus (COVID-19)?  To be considered HIGH RISK for Coronavirus (COVID-19), you have to meet the following criteria:  . Traveled to [place], [place], [place], [place] or [place]; or in the [place] to [place], [place], [place], or [place]; and have fever, cough, and shortness of breath within the last 2 weeks of travel OR . Been in close contact with a person diagnosed with COVID-19 within the last 2 weeks and have fever, cough, and shortness of breath . IF YOU DO NOT MEET THESE CRITERIA, YOU ARE CONSIDERED LOW RISK FOR COVID-19.  What to do if you are HIGH RISK for COVID-19?  . If you are having a medical emergency, call 911. . Seek medical care right away. Before you go to a doctor's office, urgent care or emergency department, call ahead and tell them about your recent travel, contact with someone diagnosed with COVID-19, and your symptoms. You should receive instructions from your physician's office regarding next steps of care.  . When you arrive at healthcare provider, tell the healthcare staff immediately you have returned from visiting [place], [place], [place], [place] or [place]; or traveled in the [place] to [place], [place], [place], or [place]; in the last two weeks or you have been in close contact with a person diagnosed with COVID-19 in the last 2 weeks.   . Tell the health care staff about your symptoms: fever, cough and shortness of breath. . After you have been seen by a medical provider, you will be either: o Tested for (COVID-19) and discharged home on quarantine except to seek medical care if symptoms worsen, and asked to  - Stay home and avoid contact with others until you get your results (4-5 days)  - Avoid travel on public transportation if possible (such as bus, train, or airplane) or o Sent to the Emergency Department by EMS for evaluation, COVID-19 testing, and possible  admission depending on your condition and test results.  What to do if you are LOW RISK for COVID-19?  Reduce your risk of any infection by using the same precautions used for avoiding the common cold or flu:  . Wash your hands often with soap and warm water for at least 20 seconds.  If soap and water are not readily available, use an alcohol-based hand sanitizer with at least 60% alcohol.  . If coughing or sneezing, cover your mouth and nose by coughing or sneezing into the elbow areas of your shirt or coat, into a tissue or into your sleeve (not your hands). . Avoid shaking hands with others and consider head nods or verbal greetings only. . Avoid touching your eyes, nose, or mouth with unwashed hands.  . Avoid close contact with people who are sick. . Avoid places or events with large numbers of people in one location, like concerts or sporting events. . Carefully consider travel plans you have or are making. . If you are planning any travel outside or inside the [place], visit the CDC's Travelers' Health webpage for the latest health notices. . If you have some symptoms but not all symptoms, continue to monitor at home and seek medical attention if your symptoms worsen. . If you are having a medical emergency, call 911.   ADDITIONAL HEALTHCARE OPTIONS FOR PATIENTS  [facility] / e-Visit: [email or web address]         [facility]: [phone]  [facility]: [phone]                   [facility]: [phone]   Prescreened. Neg .cm 

## 2018-05-07 ENCOUNTER — Ambulatory Visit (INDEPENDENT_AMBULATORY_CARE_PROVIDER_SITE_OTHER): Payer: Medicare Other | Admitting: Obstetrics and Gynecology

## 2018-05-07 ENCOUNTER — Other Ambulatory Visit: Payer: Self-pay

## 2018-05-07 VITALS — BP 118/78 | HR 81 | Ht 66.0 in | Wt 160.5 lb

## 2018-05-07 DIAGNOSIS — Z3042 Encounter for surveillance of injectable contraceptive: Secondary | ICD-10-CM

## 2018-05-07 MED ORDER — MEDROXYPROGESTERONE ACETATE 150 MG/ML IM SUSP
150.0000 mg | Freq: Once | INTRAMUSCULAR | Status: AC
  Administered 2018-05-07: 150 mg via INTRAMUSCULAR

## 2018-07-21 ENCOUNTER — Other Ambulatory Visit: Payer: Self-pay | Admitting: Obstetrics and Gynecology

## 2018-07-23 ENCOUNTER — Ambulatory Visit: Payer: Medicare Other

## 2018-07-23 NOTE — Progress Notes (Signed)
Last depo inj: 05/07/18 UPT: N/A Side effects: none Next Depo- Provera injection due: 10/10/18-10/24/18 Annual exam due: overdue  Blood pressure 114/80, pulse 85, height 5\' 6"  (1.676 m), weight 173 lb (78.5 kg), currently breastfeeding.

## 2018-07-25 ENCOUNTER — Ambulatory Visit (INDEPENDENT_AMBULATORY_CARE_PROVIDER_SITE_OTHER): Payer: Medicare Other | Admitting: Obstetrics and Gynecology

## 2018-07-25 ENCOUNTER — Other Ambulatory Visit: Payer: Self-pay

## 2018-07-25 ENCOUNTER — Ambulatory Visit: Payer: Medicare Other

## 2018-07-25 VITALS — BP 114/80 | HR 85 | Ht 66.0 in | Wt 173.0 lb

## 2018-07-25 DIAGNOSIS — Z3042 Encounter for surveillance of injectable contraceptive: Secondary | ICD-10-CM | POA: Diagnosis not present

## 2018-07-25 MED ORDER — MEDROXYPROGESTERONE ACETATE 150 MG/ML IM SUSP
150.0000 mg | Freq: Once | INTRAMUSCULAR | Status: AC
  Administered 2018-07-25: 11:00:00 150 mg via INTRAMUSCULAR

## 2018-08-12 ENCOUNTER — Encounter: Payer: Medicare Other | Admitting: Obstetrics and Gynecology

## 2018-08-19 ENCOUNTER — Encounter: Payer: Self-pay | Admitting: Obstetrics and Gynecology

## 2018-08-26 ENCOUNTER — Ambulatory Visit (INDEPENDENT_AMBULATORY_CARE_PROVIDER_SITE_OTHER): Payer: Medicare Other | Admitting: Obstetrics and Gynecology

## 2018-08-26 ENCOUNTER — Other Ambulatory Visit (HOSPITAL_COMMUNITY)
Admission: RE | Admit: 2018-08-26 | Discharge: 2018-08-26 | Disposition: A | Discharge status: Home / Self Care | Payer: Medicare Other | Source: Ambulatory Visit | Attending: Obstetrics and Gynecology | Admitting: Obstetrics and Gynecology

## 2018-08-26 ENCOUNTER — Encounter: Payer: Self-pay | Admitting: Obstetrics and Gynecology

## 2018-08-26 ENCOUNTER — Other Ambulatory Visit: Payer: Self-pay

## 2018-08-26 VITALS — BP 111/71 | HR 79 | Ht 66.0 in | Wt 170.6 lb

## 2018-08-26 DIAGNOSIS — Z124 Encounter for screening for malignant neoplasm of cervix: Secondary | ICD-10-CM | POA: Diagnosis not present

## 2018-08-26 DIAGNOSIS — Z01419 Encounter for gynecological examination (general) (routine) without abnormal findings: Secondary | ICD-10-CM

## 2018-08-26 DIAGNOSIS — Z1151 Encounter for screening for human papillomavirus (HPV): Secondary | ICD-10-CM | POA: Diagnosis not present

## 2018-08-26 DIAGNOSIS — N921 Excessive and frequent menstruation with irregular cycle: Secondary | ICD-10-CM

## 2018-08-26 NOTE — Addendum Note (Signed)
Addended by: Durwin Glaze on: 08/26/2018 04:23 PM   Modules accepted: Orders

## 2018-08-26 NOTE — Progress Notes (Signed)
HPI:      [name] is a 38 y.o. [phone] who LMP was No LMP recorded. Patient has had an injection.  Subjective:   She presents today for her annual examination.  She has been using Depo-Provera for birth control but she is now having her period in the middle of her Depo.  She is unhappy with this method of birth control.  She just has recently learned that Depo also causes bone loss and she is concerned regarding this because of her history of MS.  She would like to talk about other forms of birth control.    Hx: The following portions of the patient's history were reviewed and updated as appropriate:             She  has a past medical history of Anemia and MS (multiple sclerosis) ([facility]). She does not have any pertinent problems on file. She  has a past surgical history that includes Wisdom tooth extraction. Her family history is not on file. She  reports that she has never smoked. She has never used smokeless tobacco. She reports that she does not drink alcohol or use drugs. She has a current medication list which includes the following prescription(s): cholecalciferol, ferrous sulfate, gabapentin, ibuprofen, medroxyprogesterone, multivitamin, and taron-c dha. She has No Known Allergies.       Review of Systems:  Review of Systems  Constitutional: Denied constitutional symptoms, night sweats, recent illness, fatigue, fever, insomnia and weight loss.  Eyes: Denied eye symptoms, eye pain, photophobia, vision change and visual disturbance.  Ears/Nose/Throat/Neck: Denied ear, nose, throat or neck symptoms, hearing loss, nasal discharge, sinus congestion and sore throat.  Cardiovascular: Denied cardiovascular symptoms, arrhythmia, chest pain/pressure, edema, exercise intolerance, orthopnea and palpitations.  Respiratory: Denied pulmonary symptoms, asthma, pleuritic pain, productive sputum, cough, dyspnea and wheezing.  Gastrointestinal: Denied, gastro-esophageal reflux, melena,  nausea and vomiting.  Genitourinary: Denied genitourinary symptoms including symptomatic vaginal discharge, pelvic relaxation issues, and urinary complaints.  Musculoskeletal: Denied musculoskeletal symptoms, stiffness, swelling, muscle weakness and myalgia.  Dermatologic: Denied dermatology symptoms, rash and scar.  Neurologic: Denied neurology symptoms, dizziness, headache, neck pain and syncope.  Psychiatric: Denied psychiatric symptoms, anxiety and depression.  Endocrine: Denied endocrine symptoms including hot flashes and night sweats.   Meds:   Current Outpatient Medications on File Prior to Visit  Medication Sig Dispense Refill  . cholecalciferol (VITAMIN D) 1000 units tablet Take 1,000 Units by mouth daily.    . ferrous sulfate 325 (65 FE) MG tablet TAKE 1 TABLET BY MOUTH EVERY DAY WITH BREAKFAST 90 tablet 1  . gabapentin (NEURONTIN) 100 MG capsule Take 100 mg twice a day for one week, then increase to 200 mg(2 tablets) twice a day and continue    . ibuprofen (ADVIL,MOTRIN) 800 MG tablet Take 800 mg by mouth every 8 (eight) hours as needed.    . medroxyPROGESTERone (DEPO-PROVERA) 150 MG/ML injection INJECT 1 ML (150 MG TOTAL) INTO THE MUSCLE EVERY 3 (THREE) MONTHS. 1 mL 3  . Multiple Vitamin (MULTIVITAMIN) capsule Take 1 capsule by mouth daily.    . Prenat-FeFum-FePo-FA-Omega 3 (TARON-C DHA) 53.5-38-1 MG CAPS TAKE 1 CAPSULE BY MOUTH EVERY DAY (Patient not taking: Reported on 02/17/2018) 90 capsule 3   No current facility-administered medications on file prior to visit.     Objective:     Vitals:   08/26/18 1520  BP: 111/71  Pulse: 79              Physical examination  General NAD, Conversant  HEENT Atraumatic; Op clear with mmm.  Normo-cephalic. Pupils reactive. Anicteric sclerae  Thyroid/Neck Smooth without nodularity or enlargement. Normal ROM.  Neck Supple.  Skin No rashes, lesions or ulceration. Normal palpated skin turgor. No nodularity.  Breasts: No masses or  discharge.  Symmetric.  No axillary adenopathy.  Lungs: Clear to auscultation.No rales or wheezes. Normal Respiratory effort, no retractions.  Heart: NSR.  No murmurs or rubs appreciated. No periferal edema  Abdomen: Soft.  Non-tender.  No masses.  No HSM. No hernia  Extremities: Moves all appropriately.  Normal ROM for age. No lymphadenopathy.  Neuro: Oriented to PPT.  Normal mood. Normal affect.     Pelvic:   Vulva: Normal appearance.  No lesions.  Vagina: No lesions or abnormalities noted.  Support: Normal pelvic support.  Urethra No masses tenderness or scarring.  Meatus Normal size without lesions or prolapse.  Cervix: Normal appearance.  No lesions.  Anterior  Anus: Normal exam.  No lesions.  Perineum: Normal exam.  No lesions.        Bimanual   Uterus: Normal size.  Non-tender.  Mobile.  Mid position  Adnexae: No masses.  Non-tender to palpation.  Cul-de-sac: Negative for abnormality.      Assessment:    [id number] Patient Active Problem List   Diagnosis Date Noted  . Positive GBS test 05/02/2017  . Advanced maternal age in multigravida, first trimester 05/02/2017     1. Well woman exam with routine gynecological exam   2. Breakthrough bleeding on depo provera     Patient unhappy with breakthrough bleeding on Depo and possibility of bone loss.  Considering other forms of birth control.   Plan:            1.  Basic Screening Recommendations The basic screening recommendations for asymptomatic women were discussed with the patient during her visit.  The age-appropriate recommendations were discussed with her and the rational for the tests reviewed.  When I am informed by the patient that another primary care physician has previously obtained the age-appropriate tests and they are up-to-date, only outstanding tests are ordered and referrals given as necessary.  Abnormal results of tests will be discussed with her when all of her results are completed. Pap performed  2.   Birth Control I discussed multiple birth control options and methods with the patient.  The risks and benefits of each were reviewed. Patient considering IUD for birth control Orders No orders of the defined types were placed in this encounter.   No orders of the defined types were placed in this encounter.       F/U  Return in about 6 weeks (around 10/07/2018).  Finis Bud, M.D. 08/26/2018 4:11 PM

## 2018-08-26 NOTE — Progress Notes (Signed)
Patient comes in today for yearly physical. She is bleeding today. Pap is due. No concerns today

## 2018-09-03 LAB — CYTOLOGY - PAP
Diagnosis: NEGATIVE
HPV 16/18/45 genotyping: NEGATIVE
HPV: DETECTED — AB

## 2018-10-06 ENCOUNTER — Other Ambulatory Visit: Payer: Self-pay | Admitting: Obstetrics and Gynecology

## 2018-10-08 ENCOUNTER — Other Ambulatory Visit: Payer: Self-pay

## 2018-10-08 ENCOUNTER — Ambulatory Visit (INDEPENDENT_AMBULATORY_CARE_PROVIDER_SITE_OTHER): Payer: Medicare Other | Admitting: Obstetrics and Gynecology

## 2018-10-08 ENCOUNTER — Encounter: Payer: Self-pay | Admitting: Obstetrics and Gynecology

## 2018-10-08 VITALS — BP 116/72 | HR 84 | Wt 171.3 lb

## 2018-10-08 DIAGNOSIS — Z30430 Encounter for insertion of intrauterine contraceptive device: Secondary | ICD-10-CM | POA: Diagnosis not present

## 2018-10-08 NOTE — Progress Notes (Signed)
HPI:      [name] is a 38 y.o. [phone] who LMP was No LMP recorded. Patient has had an injection.  Subjective:   She presents today for IUD insertion.  Currently using Depo for [facility]    Hx: The following portions of the patient's history were reviewed and updated as appropriate:             She  has a past medical history of Anemia and MS (multiple sclerosis) ([place]). She does not have any pertinent problems on file. She  has a past surgical history that includes Wisdom tooth extraction. Her family history is not on file. She  reports that she has never smoked. She has never used smokeless tobacco. She reports that she does not drink alcohol or use drugs. She has a current medication list which includes the following prescription(s): cholecalciferol, ferrous sulfate, gabapentin, ibuprofen, medroxyprogesterone, multivitamin, and taron-c dha. She has No Known Allergies.       Review of Systems:  Review of Systems  Constitutional: Denied constitutional symptoms, night sweats, recent illness, fatigue, fever, insomnia and weight loss.  Eyes: Denied eye symptoms, eye pain, photophobia, vision change and visual disturbance.  Ears/Nose/Throat/Neck: Denied ear, nose, throat or neck symptoms, hearing loss, nasal discharge, sinus congestion and sore throat.  Cardiovascular: Denied cardiovascular symptoms, arrhythmia, chest pain/pressure, edema, exercise intolerance, orthopnea and palpitations.  Respiratory: Denied pulmonary symptoms, asthma, pleuritic pain, productive sputum, cough, dyspnea and wheezing.  Gastrointestinal: Denied, gastro-esophageal reflux, melena, nausea and vomiting.  Genitourinary: Denied genitourinary symptoms including symptomatic vaginal discharge, pelvic relaxation issues, and urinary complaints.  Musculoskeletal: Denied musculoskeletal symptoms, stiffness, swelling, muscle weakness and myalgia.  Dermatologic: Denied dermatology symptoms, rash and scar.  Neurologic:  Denied neurology symptoms, dizziness, headache, neck pain and syncope.  Psychiatric: Denied psychiatric symptoms, anxiety and depression.  Endocrine: Denied endocrine symptoms including hot flashes and night sweats.   Meds:   Current Outpatient Medications on File Prior to Visit  Medication Sig Dispense Refill  . cholecalciferol (VITAMIN D) 1000 units tablet Take 1,000 Units by mouth daily.    . ferrous sulfate 325 (65 FE) MG tablet TAKE 1 TABLET BY MOUTH EVERY DAY WITH BREAKFAST 90 tablet 1  . gabapentin (NEURONTIN) 100 MG capsule Take 100 mg twice a day for one week, then increase to 200 mg(2 tablets) twice a day and continue    . ibuprofen (ADVIL,MOTRIN) 800 MG tablet Take 800 mg by mouth every 8 (eight) hours as needed.    . medroxyPROGESTERone (DEPO-PROVERA) 150 MG/ML injection INJECT 1 ML (150 MG TOTAL) INTO THE MUSCLE EVERY 3 (THREE) MONTHS. 1 mL 3  . Multiple Vitamin (MULTIVITAMIN) capsule Take 1 capsule by mouth daily.    . Prenat-FeFum-FePo-FA-Omega 3 (TARON-C DHA) 53.5-38-1 MG CAPS TAKE 1 CAPSULE BY MOUTH EVERY DAY (Patient not taking: Reported on 02/17/2018) 90 capsule 3   No current facility-administered medications on file prior to visit.     Objective:     Vitals:   10/08/18 1522  BP: 116/72  Pulse: 84    Physical examination   Pelvic:   Vulva: Normal appearance.  No lesions.  Vagina: No lesions or abnormalities noted.  Support: Normal pelvic support.  Urethra No masses tenderness or scarring.  Meatus Normal size without lesions or prolapse.  Cervix: Normal appearance.  No lesions.  Anus: Normal exam.  No lesions.  Perineum: Normal exam.  No lesions.        Bimanual   Uterus: Normal size.  Non-tender.  Mobile.  AV.  Adnexae: No masses.  Non-tender to palpation.  Cul-de-sac: Negative for abnormality.   IUD Procedure Pt has read the booklet and signed the appropriate forms regarding the Mirena IUD.  All of her questions have been answered.   The cervix was  cleansed with betadine solution.  After sounding the uterus and noting the position, the IUD was placed in the usual manner without problem.  The string was cut to the appropriate length.  The patient tolerated the procedure well.              Assessment:    [id number] Patient Active Problem List   Diagnosis Date Noted  . Positive GBS test 05/02/2017  . Advanced maternal age in multigravida, first trimester 05/02/2017     1. Encounter for insertion of mirena IUD       Plan:             F/U  Return in about 4 weeks (around 11/05/2018) for For IUD f/u.  Elonda Husky, M.D. 10/08/2018 3:41 PM

## 2018-10-08 NOTE — Progress Notes (Signed)
Patient comes in today for IUD placement.  

## 2018-10-13 ENCOUNTER — Ambulatory Visit: Payer: Medicare Other

## 2018-10-18 ENCOUNTER — Other Ambulatory Visit: Payer: Self-pay | Admitting: Obstetrics and Gynecology

## 2018-11-05 ENCOUNTER — Ambulatory Visit (INDEPENDENT_AMBULATORY_CARE_PROVIDER_SITE_OTHER): Payer: Medicare Other | Admitting: Obstetrics and Gynecology

## 2018-11-05 ENCOUNTER — Other Ambulatory Visit: Payer: Self-pay

## 2018-11-05 ENCOUNTER — Encounter: Payer: Self-pay | Admitting: Obstetrics and Gynecology

## 2018-11-05 VITALS — BP 117/75 | HR 80 | Ht 66.0 in | Wt 169.2 lb

## 2018-11-05 DIAGNOSIS — Z30431 Encounter for routine checking of intrauterine contraceptive device: Secondary | ICD-10-CM | POA: Diagnosis not present

## 2018-11-05 NOTE — Progress Notes (Signed)
Patient comes in today for IUD string check. No concerns today.

## 2018-11-05 NOTE — Progress Notes (Signed)
HPI:      [name] is a 38 y.o. [phone] who LMP was No LMP recorded. (Menstrual status: IUD).  Subjective:   She presents today for IUD check.  Has no issues.  Happy with IUD.    Hx: The following portions of the patient's history were reviewed and updated as appropriate:             She  has a past medical history of Anemia and MS (multiple sclerosis) (H[facility]). She does not have any pertinent problems on file. She  has a past surgical history that includes Wisdom tooth extraction. Her family history is not on file. She  reports that she has never smoked. She has never used smokeless tobacco. She reports that she does not drink alcohol or use drugs. She has a current medication list which includes the following prescription(s): cholecalciferol, ferrous sulfate, gabapentin, and ibuprofen. She has No Known Allergies.       Review of Systems:  Review of Systems  Constitutional: Denied constitutional symptoms, night sweats, recent illness, fatigue, fever, insomnia and weight loss.  Eyes: Denied eye symptoms, eye pain, photophobia, vision change and visual disturbance.  Ears/Nose/Throat/Neck: Denied ear, nose, throat or neck symptoms, hearing loss, nasal discharge, sinus congestion and sore throat.  Cardiovascular: Denied cardiovascular symptoms, arrhythmia, chest pain/pressure, edema, exercise intolerance, orthopnea and palpitations.  Respiratory: Denied pulmonary symptoms, asthma, pleuritic pain, productive sputum, cough, dyspnea and wheezing.  Gastrointestinal: Denied, gastro-esophageal reflux, melena, nausea and vomiting.  Genitourinary: Denied genitourinary symptoms including symptomatic vaginal discharge, pelvic relaxation issues, and urinary complaints.  Musculoskeletal: Denied musculoskeletal symptoms, stiffness, swelling, muscle weakness and myalgia.  Dermatologic: Denied dermatology symptoms, rash and scar.  Neurologic: Denied neurology symptoms, dizziness, headache, neck  pain and syncope.  Psychiatric: Denied psychiatric symptoms, anxiety and depression.  Endocrine: Denied endocrine symptoms including hot flashes and night sweats.   Meds:   Current Outpatient Medications on File Prior to Visit  Medication Sig Dispense Refill  . cholecalciferol (VITAMIN D) 1000 units tablet Take 1,000 Units by mouth daily.    . ferrous sulfate 325 (65 FE) MG tablet TAKE 1 TABLET BY MOUTH EVERY DAY WITH BREAKFAST 90 tablet 1  . gabapentin (NEURONTIN) 100 MG capsule Take 100 mg twice a day for one week, then increase to 200 mg(2 tablets) twice a day and continue    . ibuprofen (ADVIL,MOTRIN) 800 MG tablet Take 800 mg by mouth every 8 (eight) hours as needed.     No current facility-administered medications on file prior to visit.     Objective:     Vitals:   11/05/18 1402  BP: 117/75  Pulse: 80              Physical examination   Pelvic:   Vulva: Normal appearance.  No lesions.  Vagina: No lesions or abnormalities noted.  Support: Normal pelvic support.  Urethra No masses tenderness or scarring.  Meatus Normal size without lesions or prolapse.  Cervix: Normal appearance.  No lesions. IUD strings noted at cervical os.  Anus: Normal exam.  No lesions.  Perineum: Normal exam.  No lesions.        Bimanual   Uterus: Normal size.  Non-tender.  Mobile.  Retroverted  Adnexae: No masses.  Non-tender to palpation.  Cul-de-sac: Negative for abnormality.    Assessment:    [id number] Patient Active Problem List   Diagnosis Date Noted  . Positive GBS test 05/02/2017  . Advanced maternal age in multigravida, first trimester  05/02/2017     1. Surveillance of previously prescribed intrauterine contraceptive device     Pt having no issues.   Plan:            1.  FU for AE. Orders No orders of the defined types were placed in this encounter.   No orders of the defined types were placed in this encounter.     F/U  Return for Annual Physical. I spent 16 minutes  involved in the care of this patient of which greater than 50% was spent discussing IUD bleeding, birth control, cycle control.  All questions answered.  Finis Bud, M.D. 11/05/2018 2:35 PM

## 2019-08-07 ENCOUNTER — Ambulatory Visit (INDEPENDENT_AMBULATORY_CARE_PROVIDER_SITE_OTHER): Payer: Medicare PPO | Admitting: Obstetrics and Gynecology

## 2019-08-07 ENCOUNTER — Encounter: Payer: Self-pay | Admitting: Obstetrics and Gynecology

## 2019-08-07 VITALS — BP 111/70 | HR 76 | Ht 66.0 in | Wt 167.3 lb

## 2019-08-07 DIAGNOSIS — Z3009 Encounter for other general counseling and advice on contraception: Secondary | ICD-10-CM | POA: Diagnosis not present

## 2019-08-07 NOTE — Progress Notes (Signed)
HPI:      Monica Wilkins is a 39 y.o. [phone] who LMP was No LMP recorded (lmp unknown). (Menstrual status: IUD).  Subjective:   She presents today because she is considering permanent sterilization.  She currently has an IUD and she has had it for the last year.  She has occasional spotting with the IUD but is generally happy with it.  She reports that her menstrual period when she is not using any form of birth control is quite heavy with significant cramping.    Hx: The following portions of the patient's history were reviewed and updated as appropriate:             She  has a past medical history of Anemia and MS (multiple sclerosis) ([facility]). She does not have any pertinent problems on file. She  has a past surgical history that includes Wisdom tooth extraction. Her family history is not on file. She  reports that she has never smoked. She has never used smokeless tobacco. She reports that she does not drink alcohol and does not use drugs. She has a current medication list which includes the following prescription(s): cholecalciferol, ferrous sulfate, gabapentin, and ibuprofen. She has No Known Allergies.       Review of Systems:  Review of Systems  Constitutional: Denied constitutional symptoms, night sweats, recent illness, fatigue, fever, insomnia and weight loss.  Eyes: Denied eye symptoms, eye pain, photophobia, vision change and visual disturbance.  Ears/Nose/Throat/Neck: Denied ear, nose, throat or neck symptoms, hearing loss, nasal discharge, sinus congestion and sore throat.  Cardiovascular: Denied cardiovascular symptoms, arrhythmia, chest pain/pressure, edema, exercise intolerance, orthopnea and palpitations.  Respiratory: Denied pulmonary symptoms, asthma, pleuritic pain, productive sputum, cough, dyspnea and wheezing.  Gastrointestinal: Denied, gastro-esophageal reflux, melena, nausea and vomiting.  Genitourinary: Denied genitourinary symptoms including symptomatic  vaginal discharge, pelvic relaxation issues, and urinary complaints.  Musculoskeletal: Denied musculoskeletal symptoms, stiffness, swelling, muscle weakness and myalgia.  Dermatologic: Denied dermatology symptoms, rash and scar.  Neurologic: Denied neurology symptoms, dizziness, headache, neck pain and syncope.  Psychiatric: Denied psychiatric symptoms, anxiety and depression.  Endocrine: Denied endocrine symptoms including hot flashes and night sweats.   Meds:   Current Outpatient Medications on File Prior to Visit  Medication Sig Dispense Refill  . cholecalciferol (VITAMIN D) 1000 units tablet Take 1,000 Units by mouth daily.    . ferrous sulfate 325 (65 FE) MG tablet TAKE 1 TABLET BY MOUTH EVERY DAY WITH BREAKFAST 90 tablet 1  . gabapentin (NEURONTIN) 100 MG capsule Take 100 mg twice a day for one week, then increase to 200 mg(2 tablets) twice a day and continue    . ibuprofen (ADVIL,MOTRIN) 800 MG tablet Take 800 mg by mouth every 8 (eight) hours as needed.     No current facility-administered medications on file prior to visit.    Objective:     Vitals:   08/07/19 0947  BP: 111/70  Pulse: 76                Assessment:    [id number] Patient Active Problem List   Diagnosis Date Noted  . Positive GBS test 05/02/2017  . Advanced maternal age in multigravida, first trimester 05/02/2017     1. Birth control counseling     After discussing permanent sterilization, especially the return to her normal menses she has decided to continue IUD use.  While she is certain that she does not want any more children, she does not want to have  her normal cycle with several days of bleeding and cramping each month. At this time her plan is to keep her IUD and get back to back IUDs until menopause.   Plan:            1.  Patient to follow-up for annual examination. Orders No orders of the defined types were placed in this encounter.   No orders of the defined types were placed in this  encounter.     F/U  Return for Annual Physical. I spent 13 minutes involved in the care of this patient preparing to see the patient by obtaining and reviewing her medical history (including labs, imaging tests and prior procedures), documenting clinical information in the electronic health record (EHR), counseling and coordinating care plans, writing and sending prescriptions, ordering tests or procedures and directly communicating with the patient by discussing pertinent items from her history and physical exam as well as detailing my assessment and plan as noted above so that she has an informed understanding.  All of her questions were answered.  Elonda Husky, M.D. 08/07/2019 10:07 AM

## 2020-03-02 ENCOUNTER — Other Ambulatory Visit: Payer: Self-pay | Admitting: Neurology

## 2020-03-02 ENCOUNTER — Other Ambulatory Visit (HOSPITAL_COMMUNITY): Payer: Self-pay | Admitting: Neurology

## 2020-03-02 DIAGNOSIS — G35 Multiple sclerosis: Secondary | ICD-10-CM

## 2020-03-07 ENCOUNTER — Ambulatory Visit: Payer: Medicare Other

## 2020-03-16 ENCOUNTER — Other Ambulatory Visit: Payer: Self-pay

## 2020-03-16 ENCOUNTER — Ambulatory Visit
Admission: RE | Admit: 2020-03-16 | Discharge: 2020-03-16 | Disposition: A | Discharge status: Home / Self Care | Payer: Medicare Other | Source: Ambulatory Visit | Attending: Neurology | Admitting: Neurology

## 2020-03-16 DIAGNOSIS — G35 Multiple sclerosis: Secondary | ICD-10-CM | POA: Insufficient documentation

## 2020-03-16 MED ORDER — GADOBUTROL 1 MMOL/ML IV SOLN
7.5000 mL | Freq: Once | INTRAVENOUS | Status: AC | PRN
  Administered 2020-03-16: 7.5 mL via INTRAVENOUS

## 2020-05-04 ENCOUNTER — Telehealth: Payer: Self-pay | Admitting: Obstetrics and Gynecology

## 2020-05-04 NOTE — Telephone Encounter (Signed)
Monica Wilkins called in and states she had a balance on her account and she doesn't understand why.  Monica Wilkins states when she was seen on 09/12/2017 she had Medicare and Medicaid and there was a $90 balance, and then on date 08/07/2019 she states she had [employer] and Medicaid and there was a balance of $30.  Patient wants to know why the insurances didn't pay and why does she have a balance.  Patient would like a call back

## 2020-05-05 ENCOUNTER — Encounter: Payer: Medicare PPO | Admitting: Obstetrics and Gynecology

## 2020-05-06 NOTE — Telephone Encounter (Signed)
Pt aware 09/12/2017 visit her medicaid was not filed due to not active until 12/2017.  08/07/2019 - visit FP medicaid was filed but eob states it was denied due to exceed fee schedule. Pt encouraged to call her case worker.

## 2020-05-09 ENCOUNTER — Encounter: Payer: Self-pay | Admitting: Obstetrics and Gynecology

## 2020-06-09 DIAGNOSIS — R202 Paresthesia of skin: Secondary | ICD-10-CM | POA: Diagnosis not present

## 2020-06-09 DIAGNOSIS — G479 Sleep disorder, unspecified: Secondary | ICD-10-CM | POA: Diagnosis not present

## 2020-06-09 DIAGNOSIS — G35 Multiple sclerosis: Secondary | ICD-10-CM | POA: Diagnosis not present

## 2020-06-09 DIAGNOSIS — R519 Headache, unspecified: Secondary | ICD-10-CM | POA: Diagnosis not present

## 2020-06-09 DIAGNOSIS — R200 Anesthesia of skin: Secondary | ICD-10-CM | POA: Diagnosis not present

## 2020-06-09 DIAGNOSIS — H5350 Unspecified color vision deficiencies: Secondary | ICD-10-CM | POA: Diagnosis not present

## 2020-06-23 ENCOUNTER — Ambulatory Visit (INDEPENDENT_AMBULATORY_CARE_PROVIDER_SITE_OTHER): Payer: Medicare Other

## 2020-06-23 ENCOUNTER — Other Ambulatory Visit: Payer: Self-pay

## 2020-06-23 ENCOUNTER — Ambulatory Visit
Admission: EM | Admit: 2020-06-23 | Discharge: 2020-06-23 | Disposition: A | Discharge status: Home / Self Care | Payer: Medicare Other | Attending: Family Medicine | Admitting: Family Medicine

## 2020-06-23 DIAGNOSIS — S92511A Displaced fracture of proximal phalanx of right lesser toe(s), initial encounter for closed fracture: Secondary | ICD-10-CM

## 2020-06-23 DIAGNOSIS — S92919A Unspecified fracture of unspecified toe(s), initial encounter for closed fracture: Secondary | ICD-10-CM | POA: Diagnosis not present

## 2020-06-23 MED ORDER — MELOXICAM 15 MG PO TABS: 15.0000 mg | ORAL_TABLET | Freq: Every day | ORAL | 0 refills | Status: DC | PRN

## 2020-06-23 NOTE — Discharge Instructions (Addendum)
Rest, ice, elevate.  Medication as prescribed.   Wear the post op shoe.  Follow up with Podiatry or Orthopedics - [facility] Orthopedics [phone]) OR [facility] [phone])  Take care  Dr. Adriana Simas

## 2020-06-23 NOTE — ED Provider Notes (Signed)
[facility]    CSN: [id number] Arrival date & time: 06/23/20  1314      History   Chief Complaint Chief Complaint  Patient presents with   Foot Injury   40 year old female presents the above complaint.  Patient states that she was walking in the sand last Wednesday and accidentally twisted her foot.  She reports continued pain to left foot.  Localizes the pain to the MTP joints of the second and third toes.  Pain 8/10 in severity.  No relieving factors.  Continues to have pain with ambulation.  No other complaints.  Past Medical History:  Diagnosis Date   Anemia    MS (multiple sclerosis) (HCC)    Patient Active Problem List   Diagnosis Date Noted   Positive GBS test 05/02/2017   Advanced maternal age in multigravida, first trimester 05/02/2017    Past Surgical History:  Procedure Laterality Date   WISDOM TOOTH EXTRACTION      OB History     Gravida  3   Para  3   Term  3   Preterm      AB      Living  3      SAB      IAB      Ectopic      Multiple  0   Live Births  3            Home Medications    Prior to Admission medications   Medication Sig Start Date End Date Taking? Authorizing Provider  meloxicam (MOBIC) 15 MG tablet Take 1 tablet (15 mg total) by mouth daily as needed for pain. 06/23/20  Yes Tayley Mudrick G, DO  cholecalciferol (VITAMIN D) 1000 units tablet Take 1,000 Units by mouth daily.    [provider]  ferrous sulfate 325 (65 FE) MG tablet TAKE 1 TABLET BY MOUTH EVERY DAY WITH BREAKFAST 01/21/18   Hildred Laser, MD  gabapentin (NEURONTIN) 100 MG capsule Take 100 mg twice a day for one week, then increase to 200 mg(2 tablets) twice a day and continue 05/14/18   [provider]    Social History Social History   Tobacco Use   Smoking status: Never   Smokeless tobacco: Never  Vaping Use   Vaping Use: Never used  Substance Use Topics   Alcohol use: No   Drug use: No     Allergies    Patient has no known allergies.   Review of Systems Review of Systems  Constitutional: Negative.   Musculoskeletal:        Foot pain/injury.    Physical Exam Triage Vital Signs ED Triage Vitals  Enc Vitals Group     BP 06/23/20 1327 113/64     Pulse Rate 06/23/20 1327 92     Resp 06/23/20 1327 18     Temp 06/23/20 1327 98.3 F (36.8 C)     Temp Source 06/23/20 1327 Oral     SpO2 06/23/20 1327 100 %     Weight 06/23/20 1328 163 lb (73.9 kg)     Height 06/23/20 1328 5\' 6"  (1.676 m)     Head Circumference --      Peak Flow --      Pain Score 06/23/20 1328 8     Pain Loc --      Pain Edu? --      Excl. in GC? --    Updated Vital Signs BP 113/64   Pulse 92  Temp 98.3 F (36.8 C) (Oral)   Resp 18   Ht 5\' 6"  (1.676 m)   Wt 73.9 kg   SpO2 100%   BMI 26.31 kg/m   Visual Acuity Right Eye Distance:   Left Eye Distance:   Bilateral Distance:    Right Eye Near:   Left Eye Near:    Bilateral Near:     Physical Exam Vitals and nursing note reviewed.  Constitutional:      General: She is not in acute distress.    Appearance: Normal appearance. She is not ill-appearing.  HENT:     Head: Normocephalic and atraumatic.  Eyes:     General:        Right eye: No discharge.        Left eye: No discharge.     Conjunctiva/sclera: Conjunctivae normal.  Pulmonary:     Effort: Pulmonary effort is normal. No respiratory distress.  Musculoskeletal:     Comments: Right foot with tenderness over the second and third MTP joints.  Mild swelling.  Neurological:     Mental Status: She is alert.  Psychiatric:        Mood and Affect: Mood normal.        Behavior: Behavior normal.     UC Treatments / Results  Labs (all labs ordered are listed, but only abnormal results are displayed) Labs Reviewed - No data to display  EKG   Radiology DG Foot Complete Right  Result Date: 06/23/2020 CLINICAL DATA:  Injury. Turned implant in the ocean recently. Pain in the  metatarsophalangeal joints, especially 2 through 4. EXAM: RIGHT FOOT COMPLETE - 3+ VIEW COMPARISON:  None. FINDINGS: There is an acute displaced fracture at the MEDIAL base of the third proximal phalanx. No radiopaque foreign body or soft tissue gas. IMPRESSION: Fracture of the base of the third proximal phalanx. Electronically Signed   By: 06/25/2020 M.D.   On: 06/23/2020 14:10    Procedures Procedures (including critical care time)  Medications Ordered in UC Medications - No data to display  Initial Impression / Assessment and Plan / UC Course  I have reviewed the triage vital signs and the nursing notes.  Pertinent labs & imaging results that were available during my care of the patient were reviewed by me and considered in my medical decision making (see chart for details).    40 year old female presents with a foot injury.  X-ray was obtained and was independently reviewed by me.  Interpretation: Displaced fracture of the base of the third proximal phalanx. Placed in post op shoe. Mobic as needed. Follow up with podiatry or Ortho.  Final Clinical Impressions(s) / UC Diagnoses   Final diagnoses:  Closed displaced fracture of proximal phalanx of toe     Discharge Instructions      Rest, ice, elevate.  Medication as prescribed.   Wear the post op shoe.  Follow up with Podiatry or Orthopedics - [facility] Orthopedics [phone]) OR [facility] [phone])  Take care  Dr. ([phone]     ED Prescriptions     Medication Sig Dispense Auth. Provider   meloxicam (MOBIC) 15 MG tablet Take 1 tablet (15 mg total) by mouth daily as needed for pain. 30 tablet Adriana Simas, DO      PDMP not reviewed this encounter.   Tommie Sams, Tommie Sams 06/23/20 1845

## 2020-06-23 NOTE — ED Triage Notes (Signed)
Pt sts she was walking in the sand last Wednesday and twisted her R foot. Sts there is tingling in her foot.

## 2020-06-30 DIAGNOSIS — S92511A Displaced fracture of proximal phalanx of right lesser toe(s), initial encounter for closed fracture: Secondary | ICD-10-CM | POA: Diagnosis not present

## 2020-07-18 DIAGNOSIS — Z79899 Other long term (current) drug therapy: Secondary | ICD-10-CM | POA: Diagnosis not present

## 2020-07-18 DIAGNOSIS — G35 Multiple sclerosis: Secondary | ICD-10-CM | POA: Diagnosis not present

## 2020-07-26 ENCOUNTER — Other Ambulatory Visit: Payer: Self-pay | Admitting: Family Medicine

## 2020-07-31 ENCOUNTER — Other Ambulatory Visit: Payer: Self-pay | Admitting: Family Medicine

## 2020-08-01 DIAGNOSIS — G35 Multiple sclerosis: Secondary | ICD-10-CM | POA: Diagnosis not present

## 2021-03-07 DIAGNOSIS — R5383 Other fatigue: Secondary | ICD-10-CM | POA: Diagnosis not present

## 2021-03-07 DIAGNOSIS — G4700 Insomnia, unspecified: Secondary | ICD-10-CM | POA: Diagnosis not present

## 2021-03-07 DIAGNOSIS — D509 Iron deficiency anemia, unspecified: Secondary | ICD-10-CM | POA: Diagnosis not present

## 2021-03-07 DIAGNOSIS — G35 Multiple sclerosis: Secondary | ICD-10-CM | POA: Diagnosis not present

## 2021-03-07 DIAGNOSIS — G43009 Migraine without aura, not intractable, without status migrainosus: Secondary | ICD-10-CM | POA: Diagnosis not present

## 2021-03-10 DIAGNOSIS — M25512 Pain in left shoulder: Secondary | ICD-10-CM | POA: Diagnosis not present

## 2021-03-10 DIAGNOSIS — M25552 Pain in left hip: Secondary | ICD-10-CM | POA: Diagnosis not present

## 2021-03-10 DIAGNOSIS — G35 Multiple sclerosis: Secondary | ICD-10-CM | POA: Diagnosis not present

## 2021-03-13 DIAGNOSIS — M25512 Pain in left shoulder: Secondary | ICD-10-CM | POA: Diagnosis not present

## 2021-03-13 DIAGNOSIS — M25552 Pain in left hip: Secondary | ICD-10-CM | POA: Diagnosis not present

## 2021-03-15 DIAGNOSIS — R200 Anesthesia of skin: Secondary | ICD-10-CM | POA: Diagnosis not present

## 2021-03-15 DIAGNOSIS — Z79899 Other long term (current) drug therapy: Secondary | ICD-10-CM | POA: Diagnosis not present

## 2021-03-15 DIAGNOSIS — G35 Multiple sclerosis: Secondary | ICD-10-CM | POA: Diagnosis not present

## 2021-03-15 DIAGNOSIS — G479 Sleep disorder, unspecified: Secondary | ICD-10-CM | POA: Diagnosis not present

## 2021-03-15 DIAGNOSIS — R519 Headache, unspecified: Secondary | ICD-10-CM | POA: Diagnosis not present

## 2021-03-15 DIAGNOSIS — R202 Paresthesia of skin: Secondary | ICD-10-CM | POA: Diagnosis not present

## 2021-03-17 ENCOUNTER — Other Ambulatory Visit: Payer: Self-pay | Admitting: Physician Assistant

## 2021-03-17 DIAGNOSIS — G35 Multiple sclerosis: Secondary | ICD-10-CM

## 2021-03-28 DIAGNOSIS — D509 Iron deficiency anemia, unspecified: Secondary | ICD-10-CM | POA: Diagnosis not present

## 2021-03-28 DIAGNOSIS — R5383 Other fatigue: Secondary | ICD-10-CM | POA: Diagnosis not present

## 2021-03-29 ENCOUNTER — Ambulatory Visit
Admission: RE | Admit: 2021-03-29 | Discharge: 2021-03-29 | Disposition: A | Discharge status: Home / Self Care | Payer: Medicare Other | Source: Ambulatory Visit | Attending: Physician Assistant | Admitting: Physician Assistant

## 2021-03-29 DIAGNOSIS — R296 Repeated falls: Secondary | ICD-10-CM | POA: Diagnosis not present

## 2021-03-29 DIAGNOSIS — G35 Multiple sclerosis: Secondary | ICD-10-CM | POA: Insufficient documentation

## 2021-03-29 DIAGNOSIS — M79642 Pain in left hand: Secondary | ICD-10-CM | POA: Diagnosis not present

## 2021-03-29 MED ORDER — GADOBUTROL 1 MMOL/ML IV SOLN
7.0000 mL | Freq: Once | INTRAVENOUS | Status: AC | PRN
  Administered 2021-03-29: 7 mL via INTRAVENOUS

## 2021-03-30 DIAGNOSIS — R5383 Other fatigue: Secondary | ICD-10-CM | POA: Diagnosis not present

## 2021-03-30 DIAGNOSIS — R946 Abnormal results of thyroid function studies: Secondary | ICD-10-CM | POA: Diagnosis not present

## 2021-03-30 DIAGNOSIS — M25552 Pain in left hip: Secondary | ICD-10-CM | POA: Diagnosis not present

## 2021-03-30 DIAGNOSIS — M7702 Medial epicondylitis, left elbow: Secondary | ICD-10-CM | POA: Diagnosis not present

## 2021-03-30 DIAGNOSIS — G35 Multiple sclerosis: Secondary | ICD-10-CM | POA: Diagnosis not present

## 2021-03-30 DIAGNOSIS — M7712 Lateral epicondylitis, left elbow: Secondary | ICD-10-CM | POA: Diagnosis not present

## 2021-03-30 DIAGNOSIS — D509 Iron deficiency anemia, unspecified: Secondary | ICD-10-CM | POA: Diagnosis not present

## 2021-04-17 DIAGNOSIS — M25512 Pain in left shoulder: Secondary | ICD-10-CM | POA: Diagnosis not present

## 2021-04-17 DIAGNOSIS — M545 Low back pain, unspecified: Secondary | ICD-10-CM | POA: Diagnosis not present

## 2021-04-17 DIAGNOSIS — M25552 Pain in left hip: Secondary | ICD-10-CM | POA: Diagnosis not present

## 2021-04-17 DIAGNOSIS — M25652 Stiffness of left hip, not elsewhere classified: Secondary | ICD-10-CM | POA: Diagnosis not present

## 2021-04-18 DIAGNOSIS — M545 Low back pain, unspecified: Secondary | ICD-10-CM | POA: Diagnosis not present

## 2021-04-18 DIAGNOSIS — M25552 Pain in left hip: Secondary | ICD-10-CM | POA: Diagnosis not present

## 2021-04-18 DIAGNOSIS — M25652 Stiffness of left hip, not elsewhere classified: Secondary | ICD-10-CM | POA: Diagnosis not present

## 2021-04-18 DIAGNOSIS — M25512 Pain in left shoulder: Secondary | ICD-10-CM | POA: Diagnosis not present

## 2021-04-19 DIAGNOSIS — Z79899 Other long term (current) drug therapy: Secondary | ICD-10-CM | POA: Diagnosis not present

## 2021-04-19 DIAGNOSIS — G35 Multiple sclerosis: Secondary | ICD-10-CM | POA: Diagnosis not present

## 2021-04-20 DIAGNOSIS — M25552 Pain in left hip: Secondary | ICD-10-CM | POA: Diagnosis not present

## 2021-04-20 DIAGNOSIS — M25652 Stiffness of left hip, not elsewhere classified: Secondary | ICD-10-CM | POA: Diagnosis not present

## 2021-04-20 DIAGNOSIS — M545 Low back pain, unspecified: Secondary | ICD-10-CM | POA: Diagnosis not present

## 2021-04-20 DIAGNOSIS — M25512 Pain in left shoulder: Secondary | ICD-10-CM | POA: Diagnosis not present

## 2021-04-25 ENCOUNTER — Encounter: Payer: Medicare Other | Admitting: Obstetrics and Gynecology

## 2021-04-25 DIAGNOSIS — H5350 Unspecified color vision deficiencies: Secondary | ICD-10-CM | POA: Diagnosis not present

## 2021-04-25 DIAGNOSIS — G35 Multiple sclerosis: Secondary | ICD-10-CM | POA: Diagnosis not present

## 2021-04-25 DIAGNOSIS — M25512 Pain in left shoulder: Secondary | ICD-10-CM | POA: Diagnosis not present

## 2021-04-25 DIAGNOSIS — R200 Anesthesia of skin: Secondary | ICD-10-CM | POA: Diagnosis not present

## 2021-04-25 DIAGNOSIS — M25552 Pain in left hip: Secondary | ICD-10-CM | POA: Diagnosis not present

## 2021-04-25 DIAGNOSIS — R202 Paresthesia of skin: Secondary | ICD-10-CM | POA: Diagnosis not present

## 2021-04-25 DIAGNOSIS — G479 Sleep disorder, unspecified: Secondary | ICD-10-CM | POA: Diagnosis not present

## 2021-04-25 DIAGNOSIS — M25652 Stiffness of left hip, not elsewhere classified: Secondary | ICD-10-CM | POA: Diagnosis not present

## 2021-04-25 DIAGNOSIS — M545 Low back pain, unspecified: Secondary | ICD-10-CM | POA: Diagnosis not present

## 2021-04-25 DIAGNOSIS — G43011 Migraine without aura, intractable, with status migrainosus: Secondary | ICD-10-CM | POA: Diagnosis not present

## 2021-04-26 DIAGNOSIS — G35 Multiple sclerosis: Secondary | ICD-10-CM | POA: Diagnosis not present

## 2021-04-26 DIAGNOSIS — Z79899 Other long term (current) drug therapy: Secondary | ICD-10-CM | POA: Diagnosis not present

## 2021-04-26 DIAGNOSIS — E559 Vitamin D deficiency, unspecified: Secondary | ICD-10-CM | POA: Diagnosis not present

## 2021-04-27 DIAGNOSIS — M545 Low back pain, unspecified: Secondary | ICD-10-CM | POA: Diagnosis not present

## 2021-04-27 DIAGNOSIS — M25512 Pain in left shoulder: Secondary | ICD-10-CM | POA: Diagnosis not present

## 2021-04-27 DIAGNOSIS — M25652 Stiffness of left hip, not elsewhere classified: Secondary | ICD-10-CM | POA: Diagnosis not present

## 2021-04-27 DIAGNOSIS — M25552 Pain in left hip: Secondary | ICD-10-CM | POA: Diagnosis not present

## 2021-04-28 DIAGNOSIS — M25552 Pain in left hip: Secondary | ICD-10-CM | POA: Diagnosis not present

## 2021-04-28 DIAGNOSIS — M25512 Pain in left shoulder: Secondary | ICD-10-CM | POA: Diagnosis not present

## 2021-04-28 DIAGNOSIS — M25652 Stiffness of left hip, not elsewhere classified: Secondary | ICD-10-CM | POA: Diagnosis not present

## 2021-04-28 DIAGNOSIS — M545 Low back pain, unspecified: Secondary | ICD-10-CM | POA: Diagnosis not present

## 2021-05-01 DIAGNOSIS — M25552 Pain in left hip: Secondary | ICD-10-CM | POA: Diagnosis not present

## 2021-05-01 DIAGNOSIS — M545 Low back pain, unspecified: Secondary | ICD-10-CM | POA: Diagnosis not present

## 2021-05-01 DIAGNOSIS — M25512 Pain in left shoulder: Secondary | ICD-10-CM | POA: Diagnosis not present

## 2021-05-01 DIAGNOSIS — M25652 Stiffness of left hip, not elsewhere classified: Secondary | ICD-10-CM | POA: Diagnosis not present

## 2021-05-02 DIAGNOSIS — M25552 Pain in left hip: Secondary | ICD-10-CM | POA: Diagnosis not present

## 2021-05-02 DIAGNOSIS — M25652 Stiffness of left hip, not elsewhere classified: Secondary | ICD-10-CM | POA: Diagnosis not present

## 2021-05-02 DIAGNOSIS — M25512 Pain in left shoulder: Secondary | ICD-10-CM | POA: Diagnosis not present

## 2021-05-02 DIAGNOSIS — M545 Low back pain, unspecified: Secondary | ICD-10-CM | POA: Diagnosis not present

## 2021-05-04 DIAGNOSIS — M25552 Pain in left hip: Secondary | ICD-10-CM | POA: Diagnosis not present

## 2021-05-04 DIAGNOSIS — M25512 Pain in left shoulder: Secondary | ICD-10-CM | POA: Diagnosis not present

## 2021-05-04 DIAGNOSIS — M25652 Stiffness of left hip, not elsewhere classified: Secondary | ICD-10-CM | POA: Diagnosis not present

## 2021-05-04 DIAGNOSIS — M545 Low back pain, unspecified: Secondary | ICD-10-CM | POA: Diagnosis not present

## 2021-05-08 DIAGNOSIS — M545 Low back pain, unspecified: Secondary | ICD-10-CM | POA: Diagnosis not present

## 2021-05-08 DIAGNOSIS — M25552 Pain in left hip: Secondary | ICD-10-CM | POA: Diagnosis not present

## 2021-05-08 DIAGNOSIS — M25652 Stiffness of left hip, not elsewhere classified: Secondary | ICD-10-CM | POA: Diagnosis not present

## 2021-05-08 DIAGNOSIS — M25512 Pain in left shoulder: Secondary | ICD-10-CM | POA: Diagnosis not present

## 2021-05-09 DIAGNOSIS — M25652 Stiffness of left hip, not elsewhere classified: Secondary | ICD-10-CM | POA: Diagnosis not present

## 2021-05-09 DIAGNOSIS — M25512 Pain in left shoulder: Secondary | ICD-10-CM | POA: Diagnosis not present

## 2021-05-09 DIAGNOSIS — M25552 Pain in left hip: Secondary | ICD-10-CM | POA: Diagnosis not present

## 2021-05-09 DIAGNOSIS — M545 Low back pain, unspecified: Secondary | ICD-10-CM | POA: Diagnosis not present

## 2021-05-11 DIAGNOSIS — M545 Low back pain, unspecified: Secondary | ICD-10-CM | POA: Diagnosis not present

## 2021-05-11 DIAGNOSIS — M25652 Stiffness of left hip, not elsewhere classified: Secondary | ICD-10-CM | POA: Diagnosis not present

## 2021-05-11 DIAGNOSIS — M25552 Pain in left hip: Secondary | ICD-10-CM | POA: Diagnosis not present

## 2021-05-11 DIAGNOSIS — M25512 Pain in left shoulder: Secondary | ICD-10-CM | POA: Diagnosis not present

## 2021-05-15 DIAGNOSIS — M25652 Stiffness of left hip, not elsewhere classified: Secondary | ICD-10-CM | POA: Diagnosis not present

## 2021-05-15 DIAGNOSIS — M25512 Pain in left shoulder: Secondary | ICD-10-CM | POA: Diagnosis not present

## 2021-05-15 DIAGNOSIS — M25552 Pain in left hip: Secondary | ICD-10-CM | POA: Diagnosis not present

## 2021-05-15 DIAGNOSIS — M545 Low back pain, unspecified: Secondary | ICD-10-CM | POA: Diagnosis not present

## 2021-05-16 DIAGNOSIS — M25512 Pain in left shoulder: Secondary | ICD-10-CM | POA: Diagnosis not present

## 2021-05-16 DIAGNOSIS — M25652 Stiffness of left hip, not elsewhere classified: Secondary | ICD-10-CM | POA: Diagnosis not present

## 2021-05-16 DIAGNOSIS — M25552 Pain in left hip: Secondary | ICD-10-CM | POA: Diagnosis not present

## 2021-05-16 DIAGNOSIS — M545 Low back pain, unspecified: Secondary | ICD-10-CM | POA: Diagnosis not present

## 2021-05-18 DIAGNOSIS — M545 Low back pain, unspecified: Secondary | ICD-10-CM | POA: Diagnosis not present

## 2021-05-18 DIAGNOSIS — M25552 Pain in left hip: Secondary | ICD-10-CM | POA: Diagnosis not present

## 2021-05-18 DIAGNOSIS — M25512 Pain in left shoulder: Secondary | ICD-10-CM | POA: Diagnosis not present

## 2021-05-18 DIAGNOSIS — M25652 Stiffness of left hip, not elsewhere classified: Secondary | ICD-10-CM | POA: Diagnosis not present

## 2021-05-22 DIAGNOSIS — M25652 Stiffness of left hip, not elsewhere classified: Secondary | ICD-10-CM | POA: Diagnosis not present

## 2021-05-22 DIAGNOSIS — M25552 Pain in left hip: Secondary | ICD-10-CM | POA: Diagnosis not present

## 2021-05-22 DIAGNOSIS — M25512 Pain in left shoulder: Secondary | ICD-10-CM | POA: Diagnosis not present

## 2021-05-22 DIAGNOSIS — M545 Low back pain, unspecified: Secondary | ICD-10-CM | POA: Diagnosis not present

## 2021-05-23 DIAGNOSIS — M25552 Pain in left hip: Secondary | ICD-10-CM | POA: Diagnosis not present

## 2021-05-23 DIAGNOSIS — M25512 Pain in left shoulder: Secondary | ICD-10-CM | POA: Diagnosis not present

## 2021-05-23 DIAGNOSIS — M545 Low back pain, unspecified: Secondary | ICD-10-CM | POA: Diagnosis not present

## 2021-05-23 DIAGNOSIS — M25652 Stiffness of left hip, not elsewhere classified: Secondary | ICD-10-CM | POA: Diagnosis not present

## 2021-05-25 DIAGNOSIS — M25652 Stiffness of left hip, not elsewhere classified: Secondary | ICD-10-CM | POA: Diagnosis not present

## 2021-05-25 DIAGNOSIS — M25552 Pain in left hip: Secondary | ICD-10-CM | POA: Diagnosis not present

## 2021-05-25 DIAGNOSIS — M25512 Pain in left shoulder: Secondary | ICD-10-CM | POA: Diagnosis not present

## 2021-05-25 DIAGNOSIS — M545 Low back pain, unspecified: Secondary | ICD-10-CM | POA: Diagnosis not present

## 2021-05-30 DIAGNOSIS — M25552 Pain in left hip: Secondary | ICD-10-CM | POA: Diagnosis not present

## 2021-05-30 DIAGNOSIS — M545 Low back pain, unspecified: Secondary | ICD-10-CM | POA: Diagnosis not present

## 2021-05-30 DIAGNOSIS — M25652 Stiffness of left hip, not elsewhere classified: Secondary | ICD-10-CM | POA: Diagnosis not present

## 2021-05-30 DIAGNOSIS — M25512 Pain in left shoulder: Secondary | ICD-10-CM | POA: Diagnosis not present

## 2021-05-31 DIAGNOSIS — M25552 Pain in left hip: Secondary | ICD-10-CM | POA: Diagnosis not present

## 2021-05-31 DIAGNOSIS — M545 Low back pain, unspecified: Secondary | ICD-10-CM | POA: Diagnosis not present

## 2021-05-31 DIAGNOSIS — M25652 Stiffness of left hip, not elsewhere classified: Secondary | ICD-10-CM | POA: Diagnosis not present

## 2021-05-31 DIAGNOSIS — M25512 Pain in left shoulder: Secondary | ICD-10-CM | POA: Diagnosis not present

## 2021-06-01 DIAGNOSIS — M25512 Pain in left shoulder: Secondary | ICD-10-CM | POA: Diagnosis not present

## 2021-06-01 DIAGNOSIS — M25552 Pain in left hip: Secondary | ICD-10-CM | POA: Diagnosis not present

## 2021-06-01 DIAGNOSIS — M25652 Stiffness of left hip, not elsewhere classified: Secondary | ICD-10-CM | POA: Diagnosis not present

## 2021-06-01 DIAGNOSIS — M545 Low back pain, unspecified: Secondary | ICD-10-CM | POA: Diagnosis not present

## 2021-06-05 DIAGNOSIS — M25512 Pain in left shoulder: Secondary | ICD-10-CM | POA: Diagnosis not present

## 2021-06-05 DIAGNOSIS — M25552 Pain in left hip: Secondary | ICD-10-CM | POA: Diagnosis not present

## 2021-06-05 DIAGNOSIS — M25652 Stiffness of left hip, not elsewhere classified: Secondary | ICD-10-CM | POA: Diagnosis not present

## 2021-06-05 DIAGNOSIS — M545 Low back pain, unspecified: Secondary | ICD-10-CM | POA: Diagnosis not present

## 2021-06-06 DIAGNOSIS — M25652 Stiffness of left hip, not elsewhere classified: Secondary | ICD-10-CM | POA: Diagnosis not present

## 2021-06-06 DIAGNOSIS — M25512 Pain in left shoulder: Secondary | ICD-10-CM | POA: Diagnosis not present

## 2021-06-06 DIAGNOSIS — M545 Low back pain, unspecified: Secondary | ICD-10-CM | POA: Diagnosis not present

## 2021-06-06 DIAGNOSIS — M25552 Pain in left hip: Secondary | ICD-10-CM | POA: Diagnosis not present

## 2021-06-08 DIAGNOSIS — M25652 Stiffness of left hip, not elsewhere classified: Secondary | ICD-10-CM | POA: Diagnosis not present

## 2021-06-08 DIAGNOSIS — M25512 Pain in left shoulder: Secondary | ICD-10-CM | POA: Diagnosis not present

## 2021-06-08 DIAGNOSIS — M25552 Pain in left hip: Secondary | ICD-10-CM | POA: Diagnosis not present

## 2021-06-08 DIAGNOSIS — M545 Low back pain, unspecified: Secondary | ICD-10-CM | POA: Diagnosis not present

## 2021-06-09 ENCOUNTER — Other Ambulatory Visit: Payer: Self-pay | Admitting: Family Medicine

## 2021-06-12 DIAGNOSIS — M25652 Stiffness of left hip, not elsewhere classified: Secondary | ICD-10-CM | POA: Diagnosis not present

## 2021-06-12 DIAGNOSIS — M25552 Pain in left hip: Secondary | ICD-10-CM | POA: Diagnosis not present

## 2021-06-12 DIAGNOSIS — M545 Low back pain, unspecified: Secondary | ICD-10-CM | POA: Diagnosis not present

## 2021-06-12 DIAGNOSIS — M25512 Pain in left shoulder: Secondary | ICD-10-CM | POA: Diagnosis not present

## 2021-06-14 DIAGNOSIS — M25512 Pain in left shoulder: Secondary | ICD-10-CM | POA: Diagnosis not present

## 2021-06-14 DIAGNOSIS — M25552 Pain in left hip: Secondary | ICD-10-CM | POA: Diagnosis not present

## 2021-06-14 DIAGNOSIS — M545 Low back pain, unspecified: Secondary | ICD-10-CM | POA: Diagnosis not present

## 2021-06-14 DIAGNOSIS — M25652 Stiffness of left hip, not elsewhere classified: Secondary | ICD-10-CM | POA: Diagnosis not present

## 2021-06-16 DIAGNOSIS — M545 Low back pain, unspecified: Secondary | ICD-10-CM | POA: Diagnosis not present

## 2021-06-16 DIAGNOSIS — M25652 Stiffness of left hip, not elsewhere classified: Secondary | ICD-10-CM | POA: Diagnosis not present

## 2021-06-16 DIAGNOSIS — M25512 Pain in left shoulder: Secondary | ICD-10-CM | POA: Diagnosis not present

## 2021-06-16 DIAGNOSIS — M25552 Pain in left hip: Secondary | ICD-10-CM | POA: Diagnosis not present

## 2021-07-07 DIAGNOSIS — D509 Iron deficiency anemia, unspecified: Secondary | ICD-10-CM | POA: Diagnosis not present

## 2021-07-07 DIAGNOSIS — R5383 Other fatigue: Secondary | ICD-10-CM | POA: Diagnosis not present

## 2021-07-07 DIAGNOSIS — R946 Abnormal results of thyroid function studies: Secondary | ICD-10-CM | POA: Diagnosis not present

## 2021-07-11 DIAGNOSIS — R5383 Other fatigue: Secondary | ICD-10-CM | POA: Diagnosis not present

## 2021-07-11 DIAGNOSIS — G43009 Migraine without aura, not intractable, without status migrainosus: Secondary | ICD-10-CM | POA: Diagnosis not present

## 2021-07-11 DIAGNOSIS — E0580 Other thyrotoxicosis without thyrotoxic crisis or storm: Secondary | ICD-10-CM | POA: Diagnosis not present

## 2021-07-11 DIAGNOSIS — G35 Multiple sclerosis: Secondary | ICD-10-CM | POA: Diagnosis not present

## 2021-07-11 DIAGNOSIS — D509 Iron deficiency anemia, unspecified: Secondary | ICD-10-CM | POA: Diagnosis not present

## 2021-07-14 DIAGNOSIS — M2550 Pain in unspecified joint: Secondary | ICD-10-CM | POA: Diagnosis not present

## 2021-07-14 DIAGNOSIS — G35 Multiple sclerosis: Secondary | ICD-10-CM | POA: Diagnosis not present

## 2021-07-14 DIAGNOSIS — D509 Iron deficiency anemia, unspecified: Secondary | ICD-10-CM | POA: Diagnosis not present

## 2021-07-14 DIAGNOSIS — G43009 Migraine without aura, not intractable, without status migrainosus: Secondary | ICD-10-CM | POA: Diagnosis not present

## 2021-10-05 DIAGNOSIS — G35 Multiple sclerosis: Secondary | ICD-10-CM | POA: Diagnosis not present

## 2021-10-05 DIAGNOSIS — F32A Depression, unspecified: Secondary | ICD-10-CM | POA: Diagnosis not present

## 2021-10-05 DIAGNOSIS — G43009 Migraine without aura, not intractable, without status migrainosus: Secondary | ICD-10-CM | POA: Diagnosis not present

## 2021-10-20 DIAGNOSIS — D509 Iron deficiency anemia, unspecified: Secondary | ICD-10-CM | POA: Diagnosis not present

## 2021-10-20 DIAGNOSIS — R5383 Other fatigue: Secondary | ICD-10-CM | POA: Diagnosis not present

## 2021-10-24 DIAGNOSIS — G43009 Migraine without aura, not intractable, without status migrainosus: Secondary | ICD-10-CM | POA: Diagnosis not present

## 2021-10-24 DIAGNOSIS — R5383 Other fatigue: Secondary | ICD-10-CM | POA: Diagnosis not present

## 2021-10-24 DIAGNOSIS — D509 Iron deficiency anemia, unspecified: Secondary | ICD-10-CM | POA: Diagnosis not present

## 2021-10-24 DIAGNOSIS — J302 Other seasonal allergic rhinitis: Secondary | ICD-10-CM | POA: Diagnosis not present

## 2021-12-08 ENCOUNTER — Encounter: Payer: Self-pay | Admitting: Internal Medicine

## 2021-12-08 ENCOUNTER — Ambulatory Visit (INDEPENDENT_AMBULATORY_CARE_PROVIDER_SITE_OTHER): Payer: Medicare Other | Admitting: Internal Medicine

## 2021-12-08 ENCOUNTER — Other Ambulatory Visit: Payer: Self-pay

## 2021-12-08 VITALS — BP 104/72 | HR 84 | Ht 66.0 in | Wt 148.0 lb

## 2021-12-08 DIAGNOSIS — E0590 Thyrotoxicosis, unspecified without thyrotoxic crisis or storm: Secondary | ICD-10-CM | POA: Diagnosis not present

## 2021-12-08 NOTE — Progress Notes (Signed)
Name: Monica Wilkins  MRN/ DOB: [id number], [birth year]    Age/ Sex: 41 y.o., female    PCP: Fayrene Helper, NP   Reason for Endocrinology Evaluation: Low TSH      Date of Initial Endocrinology Evaluation: 12/08/2021     HPI: [name] is a 41 y.o. female with a past medical history MS. The patient presented for initial endocrinology clinic visit on 12/08/2021 for consultative assistance with her low TSH .   Pt has been noted with low TSH at 0.211 uIU/mL in 03/2021  with repeat TSH in July  2023 at  0.409 uIU/mL during  routine labs. NO concomitant T4/T3  She is accompanied by her mother today Patient is on infusions for multiple sclerosis that she was diagnosed with in 2015.   She has noted with weight loss in the past but this has steadied  Denies local neck swelling  Denies loose stools or diarrhea  Denies tremors   Denies XRT  No Biotin    No FH of thyroid disease       HISTORY:  Past Medical History:  Past Medical History:  Diagnosis Date   Anemia    MS (multiple sclerosis) (HCC)    Past Surgical History:  Past Surgical History:  Procedure Laterality Date   WISDOM TOOTH EXTRACTION      Social History:  reports that she has never smoked. She has never used smokeless tobacco. She reports that she does not drink alcohol and does not use drugs. Family History: family history is not on file.   HOME MEDICATIONS: Allergies as of 12/08/2021   No Known Allergies      Medication List        Accurate as of December 08, 2021  4:44 PM. If you have any questions, ask your nurse or doctor.          busPIRone 5 MG tablet Commonly known as: BUSPAR Take 5 mg by mouth daily.   cholecalciferol 1000 units tablet Commonly known as: VITAMIN D Take 1,000 Units by mouth daily.   ferrous sulfate 325 (65 FE) MG tablet TAKE 1 TABLET BY MOUTH EVERY DAY WITH BREAKFAST   gabapentin 100 MG capsule Commonly known as: NEURONTIN Take 100 mg twice a day  for one week, then increase to 200 mg(2 tablets) twice a day and continue   meloxicam 15 MG tablet Commonly known as: MOBIC Take 1 tablet (15 mg total) by mouth daily as needed for pain.   Nurtec 75 MG Tbdp Generic drug: Rimegepant Sulfate Take 1 tablet by mouth every other day.          REVIEW OF SYSTEMS: A comprehensive ROS was conducted with the patient and is negative except as per HPI    OBJECTIVE:  VS: BP 104/72 (BP Location: Left Arm, Patient Position: Sitting, Cuff Size: Small)   Pulse 84   Ht 5\' 6"  (1.676 m)   Wt 148 lb (67.1 kg)   SpO2 97%   BMI 23.89 kg/m    Wt Readings from Last 3 Encounters:  12/08/21 148 lb (67.1 kg)  06/23/20 163 lb (73.9 kg)  08/07/19 167 lb 4.8 oz (75.9 kg)     EXAM: General: Pt appears well and is in NAD  Eyes: External eye exam normal without stare, lid lag or exophthalmos.  EOM intact.    Neck: General: Supple without adenopathy. Thyroid: Right thyroid asymmetry noted   Lungs: Clear with good BS bilat with no rales,  rhonchi, or wheezes  Heart: Auscultation: RRR.  Abdomen: Normoactive bowel sounds, soft, nontender, without masses or organomegaly palpable  Extremities:  BL LE: No pretibial edema normal ROM and strength.  Mental Status: Judgment, insight: Intact Orientation: Oriented to time, place, and person Mood and affect: No depression, anxiety, or agitation     DATA REVIEWED: Pending     ASSESSMENT/PLAN/RECOMMENDATIONS:   Subclinical Hyperthyroidism  - Pt is clinically euthyroid  - NO local neck symptoms  - Discussed D/D subacute thyroiditis vs MNG vs Graves' disease - We discussed that Graves' Disease is a result of an autoimmune condition involving the thyroid.    -We discussed with pt the benefits of methimazole in the Tx of hyperthyroidism, as well as the possible side effects/complications of anti-thyroid drug Tx (specifically detailing the rare, but serious side effect of agranulocytosis). She was informed  of need for regular thyroid function monitoring while on methimazole to ensure appropriate dosage without over-treatment. As well, we discussed the possible side effects of methimazole including the chance of rash, the small chance of liver irritation/juandice and the <=1 in 300-400 chance of sudden onset agranulocytosis.  We discussed importance of going to ED promptly (and stopping methimazole) if shewere to develop significant fever with severe sore throat of other evidence of acute infection.      -Treatment options will be considered after her lab results, patient opted not to have her labs here and will go to [facility]   F/U in 4 months   Signed electronically by: Lyndle Herrlich, MD  [facility] Endocrinology  [facility] [address] [place] [place], [place] [address] Phone: [phone] FAX: [phone]   CC: Fayrene Helper, NP [address] [place] [address] Phone: [phone] Fax: [phone]   Return to Endocrinology clinic as below: Future Appointments  Date Time Provider Department Center  04/30/2022 11:10 AM Cambrie Sonnenfeld, Konrad Dolores, MD [facility] None

## 2021-12-09 LAB — TSH: TSH: 0.811 u[IU]/mL (ref 0.450–4.500)

## 2021-12-09 LAB — THYROTROPIN RECEPTOR AUTOABS: Thyrotropin Receptor Ab: <1.1 IU/L (ref 0.00–1.75)

## 2021-12-09 LAB — T4, FREE: Free T4: 0.91 ng/dL (ref 0.82–1.77)

## 2021-12-11 DIAGNOSIS — J302 Other seasonal allergic rhinitis: Secondary | ICD-10-CM | POA: Diagnosis not present

## 2021-12-11 DIAGNOSIS — G35 Multiple sclerosis: Secondary | ICD-10-CM | POA: Diagnosis not present

## 2021-12-11 DIAGNOSIS — G43009 Migraine without aura, not intractable, without status migrainosus: Secondary | ICD-10-CM | POA: Diagnosis not present

## 2021-12-14 ENCOUNTER — Ambulatory Visit
Admission: RE | Admit: 2021-12-14 | Discharge: 2021-12-14 | Disposition: A | Discharge status: Home / Self Care | Payer: Medicare Other | Source: Ambulatory Visit | Attending: Internal Medicine | Admitting: Internal Medicine

## 2021-12-14 DIAGNOSIS — G35 Multiple sclerosis: Secondary | ICD-10-CM | POA: Diagnosis not present

## 2021-12-14 DIAGNOSIS — E0590 Thyrotoxicosis, unspecified without thyrotoxic crisis or storm: Secondary | ICD-10-CM

## 2021-12-14 DIAGNOSIS — Z79899 Other long term (current) drug therapy: Secondary | ICD-10-CM | POA: Diagnosis not present

## 2021-12-14 DIAGNOSIS — R519 Headache, unspecified: Secondary | ICD-10-CM | POA: Diagnosis not present

## 2021-12-14 DIAGNOSIS — G479 Sleep disorder, unspecified: Secondary | ICD-10-CM | POA: Diagnosis not present

## 2021-12-14 DIAGNOSIS — E041 Nontoxic single thyroid nodule: Secondary | ICD-10-CM | POA: Diagnosis not present

## 2021-12-14 DIAGNOSIS — G43011 Migraine without aura, intractable, with status migrainosus: Secondary | ICD-10-CM | POA: Diagnosis not present

## 2021-12-14 DIAGNOSIS — R200 Anesthesia of skin: Secondary | ICD-10-CM | POA: Diagnosis not present

## 2021-12-14 DIAGNOSIS — R202 Paresthesia of skin: Secondary | ICD-10-CM | POA: Diagnosis not present

## 2021-12-14 DIAGNOSIS — E559 Vitamin D deficiency, unspecified: Secondary | ICD-10-CM | POA: Diagnosis not present

## 2021-12-15 ENCOUNTER — Telehealth: Payer: Self-pay | Admitting: Internal Medicine

## 2021-12-15 DIAGNOSIS — E041 Nontoxic single thyroid nodule: Secondary | ICD-10-CM

## 2021-12-15 DIAGNOSIS — E0590 Thyrotoxicosis, unspecified without thyrotoxic crisis or storm: Secondary | ICD-10-CM

## 2021-12-15 NOTE — Telephone Encounter (Signed)
Discussed thyroid ultrasound results with the patient on 12/15/2021 at 1250pm  Patient in agreement of proceeding with FNA of the right thyroid nodule The process was explained to the patient and was given the opportunity to ask questions     Thyroid ultrasound 12/14/2021 Estimated total number of nodules >/= 1 cm: 1   Number of spongiform nodules >/=  2 cm not described below (TR1): 0   Number of mixed cystic and solid nodules >/= 1.5 cm not described below (TR2): 0   _________________________________________________________   Nodule # 1:   Location: RIGHT; Inferior   Maximum size: 1.6 cm; Other 2 dimensions: 0.8 x 1.3 cm   Composition: solid/almost completely solid (2)   Echogenicity: hypoechoic (2)   Shape: not taller-than-wide (0)   Margins: smooth (0)   Echogenic foci: none (0)   ACR TI-RADS total points: 4.   ACR TI-RADS risk category: TR4 (4-6 points).   ACR TI-RADS recommendations:   **Given size (>/= 1.5 cm) and appearance, fine needle aspiration of this moderately suspicious nodule should be considered based on TI-RADS criteria.   _________________________________________________________   No cervical adenopathy or abnormal fluid collection within the imaged neck.   IMPRESSION: 1. Mild heterogeneity of the thyroid gland. 2. Single, 1.6 cm RIGHT inferior TR-4 thyroid nodule. Fine needle aspiration of this moderately suspicious nodule should be considered based on TI-RADS criteria.   Abby Raelyn Mora, MD  [facility] Endocrinology  [facility] [address] [place], [place] [address] Phone: [phone] FAX: [phone]

## 2021-12-19 ENCOUNTER — Ambulatory Visit: Payer: Medicare Other | Admitting: Internal Medicine

## 2021-12-27 DIAGNOSIS — H40003 Preglaucoma, unspecified, bilateral: Secondary | ICD-10-CM | POA: Diagnosis not present

## 2022-01-04 DIAGNOSIS — Z1231 Encounter for screening mammogram for malignant neoplasm of breast: Secondary | ICD-10-CM | POA: Diagnosis not present

## 2022-01-04 DIAGNOSIS — R92333 Mammographic heterogeneous density, bilateral breasts: Secondary | ICD-10-CM | POA: Diagnosis not present

## 2022-01-11 DIAGNOSIS — R200 Anesthesia of skin: Secondary | ICD-10-CM | POA: Diagnosis not present

## 2022-01-11 DIAGNOSIS — E559 Vitamin D deficiency, unspecified: Secondary | ICD-10-CM | POA: Diagnosis not present

## 2022-01-11 DIAGNOSIS — G8929 Other chronic pain: Secondary | ICD-10-CM | POA: Diagnosis not present

## 2022-01-11 DIAGNOSIS — G479 Sleep disorder, unspecified: Secondary | ICD-10-CM | POA: Diagnosis not present

## 2022-01-11 DIAGNOSIS — R202 Paresthesia of skin: Secondary | ICD-10-CM | POA: Diagnosis not present

## 2022-01-11 DIAGNOSIS — G43011 Migraine without aura, intractable, with status migrainosus: Secondary | ICD-10-CM | POA: Diagnosis not present

## 2022-01-11 DIAGNOSIS — G35 Multiple sclerosis: Secondary | ICD-10-CM | POA: Diagnosis not present

## 2022-01-11 DIAGNOSIS — Z79899 Other long term (current) drug therapy: Secondary | ICD-10-CM | POA: Diagnosis not present

## 2022-01-11 DIAGNOSIS — M545 Low back pain, unspecified: Secondary | ICD-10-CM | POA: Diagnosis not present

## 2022-01-12 DIAGNOSIS — R32 Unspecified urinary incontinence: Secondary | ICD-10-CM | POA: Diagnosis not present

## 2022-01-12 DIAGNOSIS — G35 Multiple sclerosis: Secondary | ICD-10-CM | POA: Diagnosis not present

## 2022-01-12 DIAGNOSIS — Z79899 Other long term (current) drug therapy: Secondary | ICD-10-CM | POA: Diagnosis not present

## 2022-01-24 ENCOUNTER — Ambulatory Visit
Admission: RE | Admit: 2022-01-24 | Discharge: 2022-01-24 | Disposition: A | Discharge status: Home / Self Care | Payer: Medicare HMO | Source: Ambulatory Visit | Attending: Internal Medicine | Admitting: Internal Medicine

## 2022-01-24 ENCOUNTER — Other Ambulatory Visit (HOSPITAL_COMMUNITY)
Admission: RE | Admit: 2022-01-24 | Discharge: 2022-01-24 | Disposition: A | Discharge status: Home / Self Care | Payer: Medicare HMO | Source: Ambulatory Visit | Attending: Internal Medicine | Admitting: Internal Medicine

## 2022-01-24 DIAGNOSIS — E041 Nontoxic single thyroid nodule: Secondary | ICD-10-CM

## 2022-01-26 LAB — CYTOLOGY - NON PAP

## 2022-01-31 IMAGING — CR DG FOOT COMPLETE 3+V*R*
3 series · 3 of 3 positions shown · non-contrast
Comparison: None.

CLINICAL DATA: Injury. Turned implant in the ocean recently. Pain
in the metatarsophalangeal joints, especially 2 through 4.

EXAM:
RIGHT FOOT COMPLETE - 3+ VIEW

[foot ap]
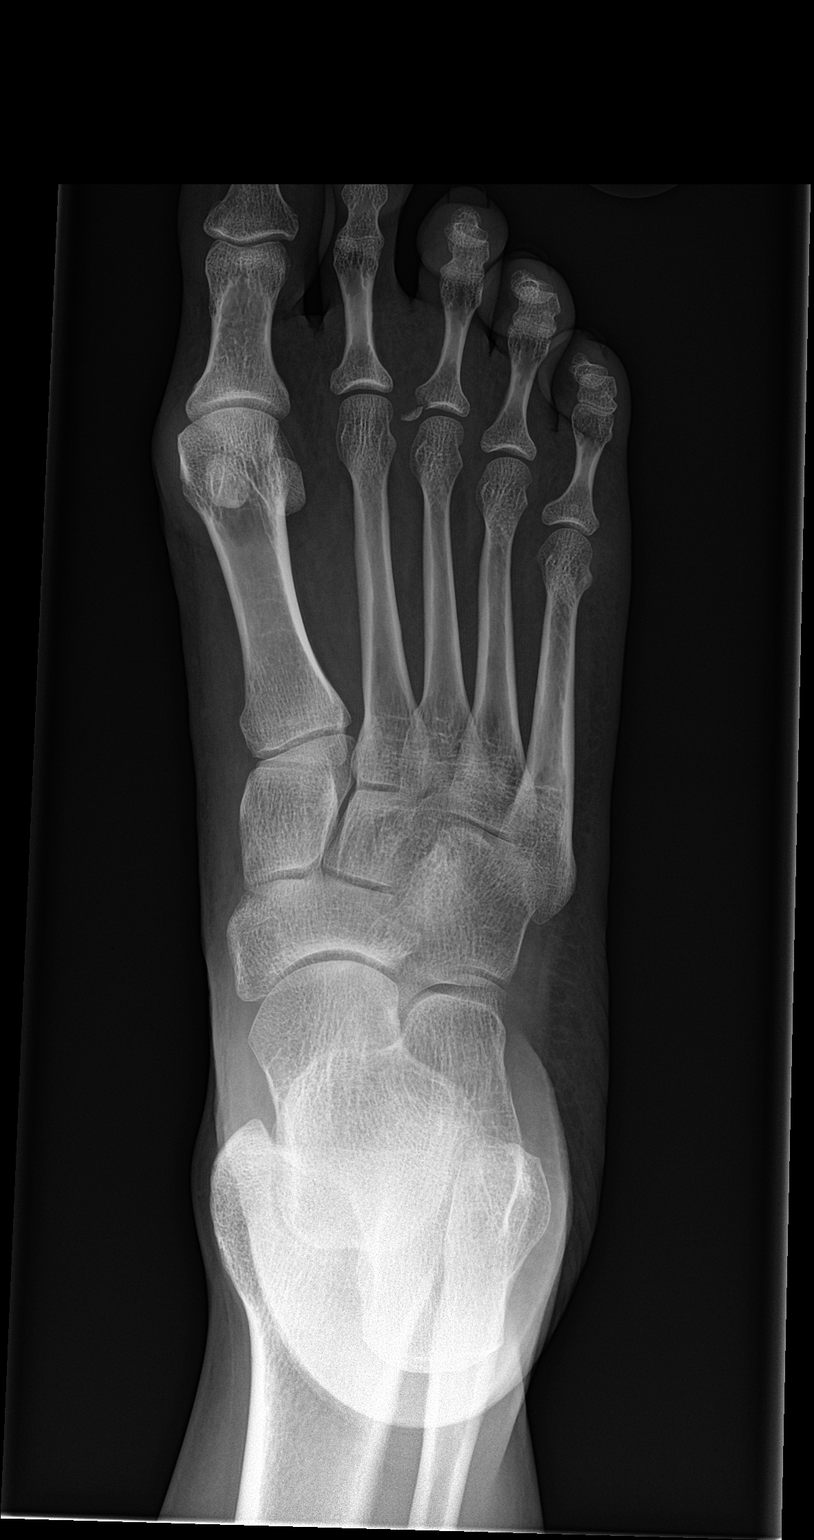

[foot obl]
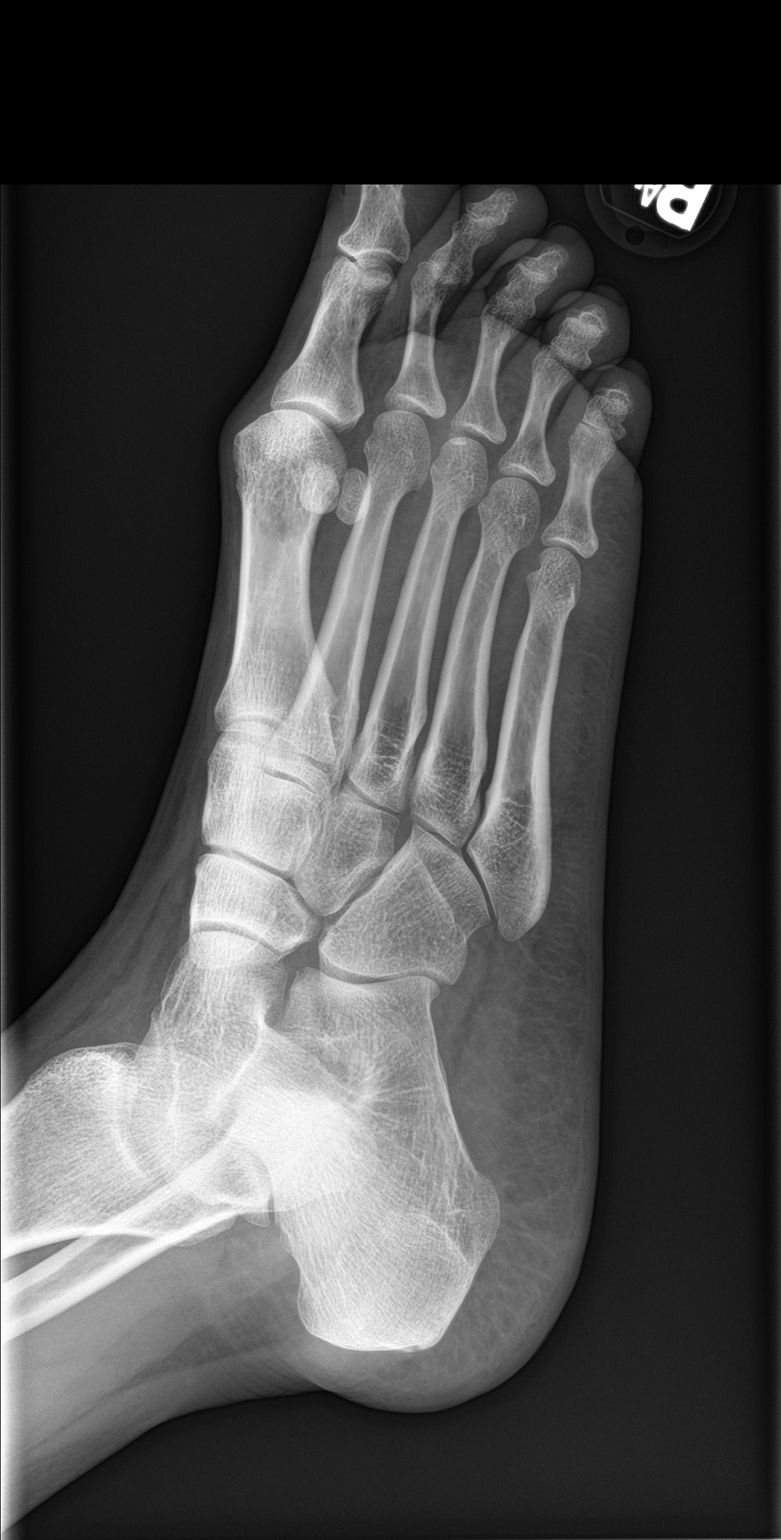

[foot lat]
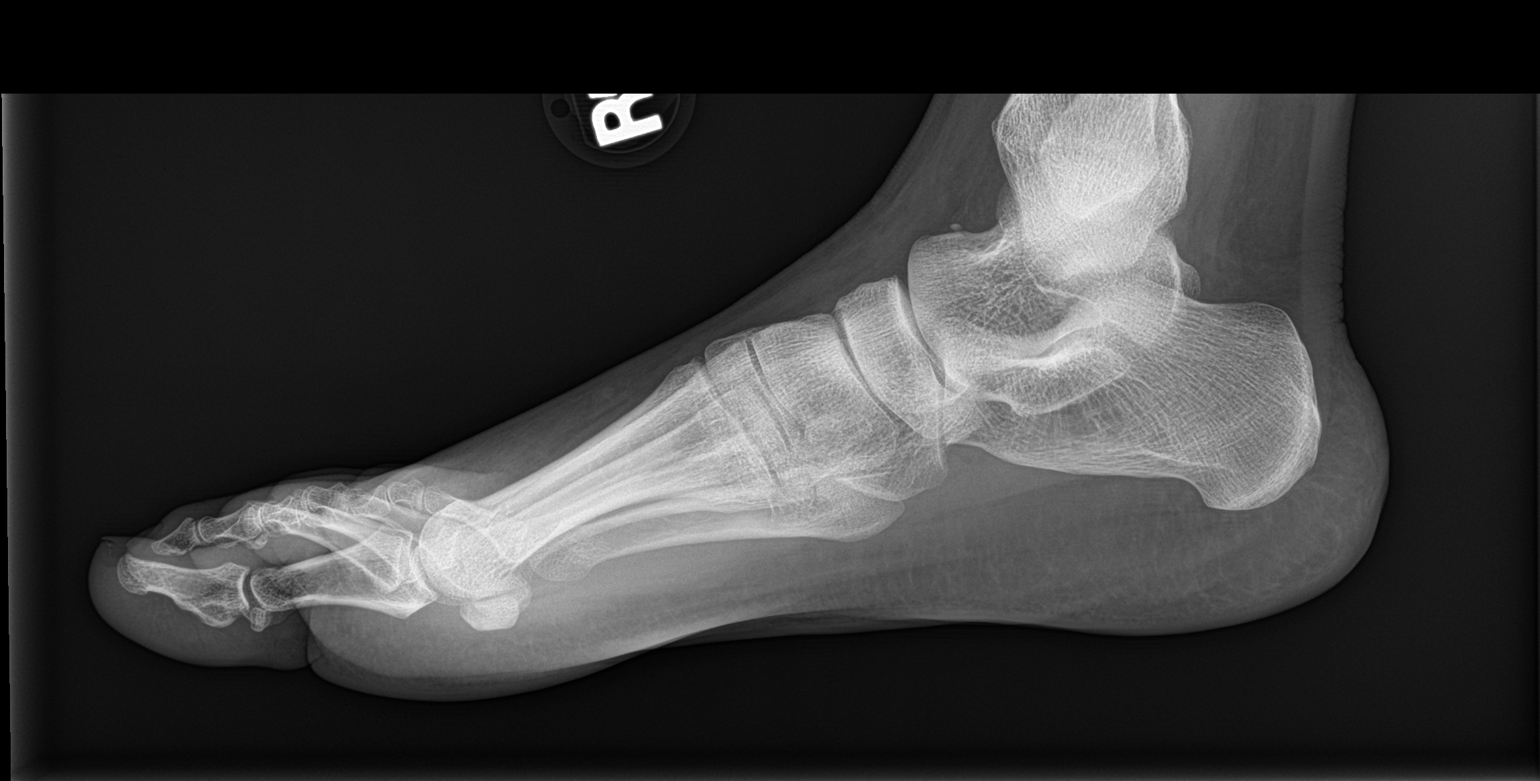

[3 of 3 positions shown; findings below may reference images not displayed]

FINDINGS: There is an acute displaced fracture at the MEDIAL base of the third
proximal phalanx. No radiopaque foreign body or soft tissue gas.
IMPRESSION: Fracture of the base of the third proximal phalanx.

## 2022-02-01 ENCOUNTER — Telehealth: Payer: Self-pay

## 2022-02-01 NOTE — Telephone Encounter (Signed)
Sending mychart msg. AS, CMA 

## 2022-02-14 DIAGNOSIS — G8929 Other chronic pain: Secondary | ICD-10-CM | POA: Diagnosis not present

## 2022-02-23 ENCOUNTER — Ambulatory Visit (INDEPENDENT_AMBULATORY_CARE_PROVIDER_SITE_OTHER): Payer: Medicare HMO | Admitting: Nurse Practitioner

## 2022-02-23 VITALS — BP 132/72 | HR 86 | Ht 66.75 in | Wt 142.0 lb

## 2022-02-23 DIAGNOSIS — G43009 Migraine without aura, not intractable, without status migrainosus: Secondary | ICD-10-CM

## 2022-02-23 DIAGNOSIS — R5383 Other fatigue: Secondary | ICD-10-CM

## 2022-02-23 DIAGNOSIS — R7301 Impaired fasting glucose: Secondary | ICD-10-CM

## 2022-02-23 DIAGNOSIS — F419 Anxiety disorder, unspecified: Secondary | ICD-10-CM | POA: Diagnosis not present

## 2022-02-23 DIAGNOSIS — G35 Multiple sclerosis: Secondary | ICD-10-CM | POA: Diagnosis not present

## 2022-02-23 NOTE — Progress Notes (Signed)
Established Patient Office Visit  Subjective:  Patient ID: Monica Wilkins, female    DOB: [birth year]  Age: 42 y.o. MRN: [id number]  Chief Complaint  Patient presents with   Follow-up    4 Months Follow Up    4 month follow up, has not had fasting labs.  Filled out disability placard for parking.       Past Medical History:  Diagnosis Date   Anemia    MS (multiple sclerosis) (HCC)     Social History   Socioeconomic History   Marital status: Single    Spouse name: Alunza   Number of children: 2   Years of education: Not on file   Highest education level: Not on file  Occupational History   Not on file  Tobacco Use   Smoking status: Never   Smokeless tobacco: Never  Vaping Use   Vaping Use: Never used  Substance and Sexual Activity   Alcohol use: No   Drug use: No   Sexual activity: Yes    Birth control/protection: None, Injection  Other Topics Concern   Not on file  Social History Narrative   Not on file   Social Determinants of Health   Financial Resource Strain: Low Risk  (05/14/2017)   Overall Financial Resource Strain (CARDIA)    Difficulty of Paying Living Expenses: Not very hard  Food Insecurity: No Food Insecurity (05/14/2017)   Hunger Vital Sign    Worried About Running Out of Food in the Last Year: Never true    Ran Out of Food in the Last Year: Never true  Transportation Needs: No Transportation Needs (05/14/2017)   PRAPARE - Hydrologist (Medical): No    Lack of Transportation (Non-Medical): No  Physical Activity: Sufficiently Active (05/14/2017)   Exercise Vital Sign    Days of Exercise per Week: 7 days    Minutes of Exercise per Session: 30 min  Stress: No Stress Concern Present (05/14/2017)   [place]    Feeling of Stress : Not at all  Social Connections: Unknown (05/14/2017)   Social Connection and Isolation Panel [NHANES]    Frequency of  Communication with Friends and Family: More than three times a week    Frequency of Social Gatherings with Friends and Family: More than three times a week    Attends Religious Services: Not on file    Active Member of Clubs or Organizations: Patient refused    Attends Archivist Meetings: Never    Marital Status: Married  Human resources officer Violence: Not on file    No family history on file.  Allergies  Allergen Reactions   Orange Flavor [Orange Oil] Swelling    Tongue swelling    Review of Systems  Constitutional: Negative.   HENT: Negative.    Eyes: Negative.   Respiratory: Negative.    Cardiovascular: Negative.   Gastrointestinal: Negative.   Genitourinary: Negative.   Musculoskeletal:  Positive for joint pain and myalgias.  Skin: Negative.   Neurological:  Positive for headaches.  Endo/Heme/Allergies: Negative.   Psychiatric/Behavioral:  Positive for depression. The patient is nervous/anxious.        Objective:   BP 132/72   Pulse 86   Ht 5' 6.75" (1.695 m)   Wt 142 lb (64.4 kg)   SpO2 98%   BMI 22.41 kg/m   Vitals:   02/23/22 0909  BP: 132/72  Pulse: 86  Height: 5' 6.75" (  1.695 m)  Weight: 142 lb (64.4 kg)  SpO2: 98%  BMI (Calculated): 22.42    Physical Exam Vitals reviewed.  Constitutional:      Appearance: Normal appearance.  HENT:     Head: Normocephalic.     Nose: Nose normal.     Mouth/Throat:     Mouth: Mucous membranes are moist.  Eyes:     Pupils: Pupils are equal, round, and reactive to light.  Cardiovascular:     Rate and Rhythm: Normal rate and regular rhythm.  Pulmonary:     Effort: Pulmonary effort is normal.     Breath sounds: Normal breath sounds.  Abdominal:     General: Bowel sounds are normal.     Palpations: Abdomen is soft.  Musculoskeletal:        General: Tenderness present.     Cervical back: Normal range of motion and neck supple.  Skin:    General: Skin is warm and dry.  Neurological:     Mental  Status: She is alert and oriented to person, place, and time.  Psychiatric:        Mood and Affect: Mood normal.        Behavior: Behavior normal.      No results found for any visits on 02/23/22.  Recent Results (from the past 2160 hour(s))  Thyrotropin receptor autoabs     Status: None   Collection Time: 12/08/21  2:48 PM  Result Value Ref Range   Thyrotropin Receptor Ab <1.10 0.00 - 1.75 IU/L  T4, free     Status: None   Collection Time: 12/08/21  2:48 PM  Result Value Ref Range   Free T4 0.91 0.82 - 1.77 ng/dL  TSH     Status: None   Collection Time: 12/08/21  2:48 PM  Result Value Ref Range   TSH 0.811 0.450 - 4.500 uIU/mL  Cytology - Non PAP; RIGHT LOBE THYROID     Status: None   Collection Time: 01/24/22  1:43 PM  Result Value Ref Range   CYTOLOGY - NON GYN      CYTOLOGY - NON PAP CASE: [id number] PATIENT: Ninoshka Minahan Non-Gynecological Cytology Report     Clinical History: Nodule #1: Right; Inferior, Maximum size: 1.6 cm; Other 2 dimensions: 0.8 x 1.3 cm, solid/almost completely solid, hypoechoic, TI-RADS total points: 4. Specimen Submitted:  A. THYROID, RT INFERIOR, FINE NEEDLE ASPIRATION:   FINAL MICROSCOPIC DIAGNOSIS: - Consistent with benign follicular nodule (Bethesda category II)  SPECIMEN ADEQUACY: Satisfactory for evaluation  GROSS: Received is/are 30 cc's of pale pink cytolyt solution and 6 slides in 95% ethyl alcohol. (EMH:emh) Prepared: Smears:  6 Concentration Method (Thin Prep):  1 Cell Block:  Cell block attempted, not obtained. Additional Studies: Afirma collected.     Final Diagnosis performed by Tilford Pillar DO.   Electronically signed 01/26/2022 Technical component performed at [employer]. [facility], [place] [address], [place], [place] [address].  Professional component performed  at [facility], [place] [address] [place], [place] [address].  Immunohistochemistry Technical component (if applicable)  was performed at [facility]. [address], [place], [place], [place] [address].   IMMUNOHISTOCHEMISTRY DISCLAIMER (if applicable): Some of these immunohistochemical stains may have been developed and the performance characteristics determine by [facility]. Some may not have been cleared or approved by the U.S. Food and Drug Administration. The FDA has determined that such clearance or approval is not necessary. This test is used for clinical  purposes. It should not be regarded as investigational or for research. This laboratory is certified under the [place] (CLIA-88) as qualified to perform high complexity clinical laboratory testing.  The controls stained appropriately.       Assessment & Plan:   Problem List Items Addressed This Visit       Cardiovascular and Mediastinum   Migraine without aura and without status migrainosus, not intractable   Relevant Medications   baclofen (LIORESAL) 10 MG tablet   citalopram (CELEXA) 20 MG tablet     Nervous and Auditory   Multiple sclerosis (HCC) - Primary     Other   Other fatigue   Relevant Orders   TSH   CMP14+EGFR   Anxiety   Relevant Medications   citalopram (CELEXA) 20 MG tablet   Other Visit Diagnoses     Impaired fasting blood sugar       Relevant Orders   Hemoglobin A1c   Lipid panel       Return in about 4 months (around 06/24/2022).   Total time spent: 30 minutes  Evern Bio, NP  02/23/2022

## 2022-02-23 NOTE — Patient Instructions (Signed)
1) Fasting labs today 2) Filled disability form 3) Follow up appt in 4 months, RTC 3 days prior

## 2022-02-24 LAB — CMP14+EGFR
ALT: 20 IU/L (ref 0–32)
AST: 15 IU/L (ref 0–40)
Albumin/Globulin Ratio: 1.7 (ref 1.2–2.2)
Albumin: 4.4 g/dL (ref 3.9–4.9)
Alkaline Phosphatase: 55 IU/L (ref 44–121)
BUN/Creatinine Ratio: 12 (ref 9–23)
BUN: 8 mg/dL (ref 6–24)
Bilirubin Total: 0.4 mg/dL (ref 0.0–1.2)
CO2: 23 mmol/L (ref 20–29)
Calcium: 9.4 mg/dL (ref 8.7–10.2)
Chloride: 107 mmol/L — ABNORMAL HIGH (ref 96–106)
Creatinine, Ser: 0.65 mg/dL (ref 0.57–1.00)
Globulin, Total: 2.6 g/dL (ref 1.5–4.5)
Glucose: 65 mg/dL — ABNORMAL LOW (ref 70–99)
Potassium: 4.6 mmol/L (ref 3.5–5.2)
Sodium: 143 mmol/L (ref 134–144)
Total Protein: 7 g/dL (ref 6.0–8.5)
eGFR: 113 mL/min/{1.73_m2} (ref >59)

## 2022-02-24 LAB — LIPID PANEL
Chol/HDL Ratio: 2.6 ratio (ref 0.0–4.4)
Cholesterol, Total: 145 mg/dL (ref 100–199)
HDL: 55 mg/dL (ref >39)
LDL Chol Calc (NIH): 79 mg/dL (ref 0–99)
Triglycerides: 52 mg/dL (ref 0–149)
VLDL Cholesterol Cal: 11 mg/dL (ref 5–40)

## 2022-02-24 LAB — TSH: TSH: 0.392 u[IU]/mL — ABNORMAL LOW (ref 0.450–4.500)

## 2022-02-24 LAB — HEMOGLOBIN A1C
Est. average glucose Bld gHb Est-mCnc: 114 mg/dL
Hgb A1c MFr Bld: 5.6 % (ref 4.8–5.6)

## 2022-03-01 DIAGNOSIS — G8929 Other chronic pain: Secondary | ICD-10-CM | POA: Diagnosis not present

## 2022-03-15 ENCOUNTER — Encounter: Payer: Medicare HMO | Admitting: Nurse Practitioner

## 2022-04-30 ENCOUNTER — Ambulatory Visit: Payer: Medicare Other | Admitting: Internal Medicine

## 2022-06-21 ENCOUNTER — Other Ambulatory Visit: Payer: Medicare HMO

## 2022-06-21 ENCOUNTER — Other Ambulatory Visit: Payer: Self-pay | Admitting: Nurse Practitioner

## 2022-06-21 DIAGNOSIS — R5383 Other fatigue: Secondary | ICD-10-CM | POA: Diagnosis not present

## 2022-06-21 DIAGNOSIS — E663 Overweight: Secondary | ICD-10-CM | POA: Diagnosis not present

## 2022-06-21 DIAGNOSIS — R946 Abnormal results of thyroid function studies: Secondary | ICD-10-CM | POA: Diagnosis not present

## 2022-06-21 DIAGNOSIS — D509 Iron deficiency anemia, unspecified: Secondary | ICD-10-CM | POA: Diagnosis not present

## 2022-06-22 LAB — COMPREHENSIVE METABOLIC PANEL
ALT: 75 IU/L — ABNORMAL HIGH (ref 0–32)
AST: 37 IU/L (ref 0–40)
Albumin: 4.2 g/dL (ref 3.9–4.9)
Alkaline Phosphatase: 47 IU/L (ref 44–121)
BUN/Creatinine Ratio: 13 (ref 9–23)
BUN: 8 mg/dL (ref 6–24)
Bilirubin Total: 0.4 mg/dL (ref 0.0–1.2)
CO2: 23 mmol/L (ref 20–29)
Calcium: 8.8 mg/dL (ref 8.7–10.2)
Chloride: 104 mmol/L (ref 96–106)
Creatinine, Ser: 0.61 mg/dL (ref 0.57–1.00)
Globulin, Total: 2.2 g/dL (ref 1.5–4.5)
Glucose: 79 mg/dL (ref 70–99)
Potassium: 4.3 mmol/L (ref 3.5–5.2)
Sodium: 140 mmol/L (ref 134–144)
Total Protein: 6.4 g/dL (ref 6.0–8.5)
eGFR: 115 mL/min/{1.73_m2} (ref >59)

## 2022-06-22 LAB — CBC WITH DIFFERENTIAL/PLATELET
Basophils Absolute: 0.1 10*3/uL (ref 0.0–0.2)
Basos: 2 %
EOS (ABSOLUTE): 0.2 10*3/uL (ref 0.0–0.4)
Eos: 3 %
Hematocrit: 32.3 % — ABNORMAL LOW (ref 34.0–46.6)
Hemoglobin: 10.1 g/dL — ABNORMAL LOW (ref 11.1–15.9)
Immature Grans (Abs): 0 10*3/uL (ref 0.0–0.1)
Immature Granulocytes: 0 %
Lymphocytes Absolute: 1.5 10*3/uL (ref 0.7–3.1)
Lymphs: 26 %
MCH: 28.3 pg (ref 26.6–33.0)
MCHC: 31.3 g/dL — ABNORMAL LOW (ref 31.5–35.7)
MCV: 91 fL (ref 79–97)
Monocytes Absolute: 0.5 10*3/uL (ref 0.1–0.9)
Monocytes: 8 %
Neutrophils Absolute: 3.7 10*3/uL (ref 1.4–7.0)
Neutrophils: 61 %
Platelets: 313 10*3/uL (ref 150–450)
RBC: 3.57 x10E6/uL — ABNORMAL LOW (ref 3.77–5.28)
RDW: 13.3 % (ref 11.7–15.4)
WBC: 6 10*3/uL (ref 3.4–10.8)

## 2022-06-22 LAB — LIPID PANEL W/O CHOL/HDL RATIO
Cholesterol, Total: 161 mg/dL (ref 100–199)
HDL: 59 mg/dL (ref >39)
LDL Chol Calc (NIH): 92 mg/dL (ref 0–99)
Triglycerides: 45 mg/dL (ref 0–149)
VLDL Cholesterol Cal: 10 mg/dL (ref 5–40)

## 2022-06-22 LAB — TSH: TSH: 0.48 u[IU]/mL (ref 0.450–4.500)

## 2022-06-25 ENCOUNTER — Ambulatory Visit (INDEPENDENT_AMBULATORY_CARE_PROVIDER_SITE_OTHER): Payer: Medicare HMO | Admitting: Internal Medicine

## 2022-06-25 ENCOUNTER — Encounter: Payer: Self-pay | Admitting: Internal Medicine

## 2022-06-25 VITALS — BP 120/80 | HR 68 | Ht 66.0 in | Wt 152.0 lb

## 2022-06-25 DIAGNOSIS — G35 Multiple sclerosis: Secondary | ICD-10-CM | POA: Diagnosis not present

## 2022-06-25 DIAGNOSIS — G43009 Migraine without aura, not intractable, without status migrainosus: Secondary | ICD-10-CM | POA: Diagnosis not present

## 2022-06-25 DIAGNOSIS — R7301 Impaired fasting glucose: Secondary | ICD-10-CM

## 2022-06-25 DIAGNOSIS — R748 Abnormal levels of other serum enzymes: Secondary | ICD-10-CM | POA: Diagnosis not present

## 2022-06-25 MED ORDER — BACLOFEN 10 MG PO TABS: 10.0000 mg | ORAL_TABLET | Freq: Two times a day (BID) | ORAL | 1 refills | Status: DC

## 2022-06-25 NOTE — Progress Notes (Signed)
Established Patient Office Visit  Subjective:  Patient ID: Monica Wilkins, female    DOB: 18-May-1980  Age: 42 y.o. MRN: [id number]  Chief Complaint  Patient presents with   Follow-up    4 month follow up    Patient in office for 6 month follow up. Patient reports a break through episode of her MS on her previous MS medication. Reports having all over body aches except her face. Patient was taking a muscle relaxer and gabapentin to help with symptoms. Wants to try to control her MS symptoms naturally. Reports she has found hot baths and hot tubs help relax her muscles. Today her only complaint is left arm pain. Patient had blood work done in June. Elevated liver enzyme. Will repeat prior to next visit.  Patient under care of Neurology for MS treatment. Supposed to be on Ocrevus q 6 month injection- but not helping- Will call neurologist for follow up.    No other concerns at this time.   Past Medical History:  Diagnosis Date   Anemia    MS (multiple sclerosis) (HCC)     Past Surgical History:  Procedure Laterality Date   WISDOM TOOTH EXTRACTION      Social History   Socioeconomic History   Marital status: Single    Spouse name: Alunza   Number of children: 2   Years of education: Not on file   Highest education level: Not on file  Occupational History   Not on file  Tobacco Use   Smoking status: Never   Smokeless tobacco: Never  Vaping Use   Vaping Use: Never used  Substance and Sexual Activity   Alcohol use: No   Drug use: No   Sexual activity: Yes    Birth control/protection: None, Injection  Other Topics Concern   Not on file  Social History Narrative   Not on file   Social Determinants of Health   Financial Resource Strain: Low Risk  (05/14/2017)   Overall Financial Resource Strain (CARDIA)    Difficulty of Paying Living Expenses: Not very hard  Food Insecurity: No Food Insecurity (05/14/2017)   Hunger Vital Sign    Worried About Running Out of Food  in the Last Year: Never true    Ran Out of Food in the Last Year: Never true  Transportation Needs: No Transportation Needs (05/14/2017)   PRAPARE - Administrator, Civil Service (Medical): No    Lack of Transportation (Non-Medical): No  Physical Activity: Sufficiently Active (05/14/2017)   Exercise Vital Sign    Days of Exercise per Week: 7 days    Minutes of Exercise per Session: 30 min  Stress: No Stress Concern Present (05/14/2017)   [employer] of Occupational Health - Occupational Stress Questionnaire    Feeling of Stress : Not at all  Social Connections: Unknown (05/14/2017)   Social Connection and Isolation Panel [NHANES]    Frequency of Communication with Friends and Family: More than three times a week    Frequency of Social Gatherings with Friends and Family: More than three times a week    Attends Religious Services: Not on Marketing executive or Organizations: Patient declined    Attends Banker Meetings: Never    Marital Status: Married  Catering manager Violence: Not on file    History reviewed. No pertinent family history.  Allergies  Allergen Reactions   Orange Flavor [Orange Oil] Swelling    Tongue swelling  Review of Systems  Constitutional: Negative.   HENT: Negative.    Eyes: Negative.   Respiratory: Negative.  Negative for cough and shortness of breath.   Cardiovascular: Negative.  Negative for chest pain and palpitations.  Gastrointestinal: Negative.  Negative for abdominal pain, constipation, diarrhea, heartburn, nausea and vomiting.  Genitourinary: Negative.  Negative for dysuria.  Musculoskeletal: Negative.   Skin: Negative.   Neurological: Negative.  Negative for dizziness and headaches.  Endo/Heme/Allergies: Negative.   Psychiatric/Behavioral: Negative.         Objective:   BP 120/80   Pulse 68   Ht 5\' 6"  (1.676 m)   Wt 152 lb (68.9 kg)   SpO2 98%   BMI 24.53 kg/m   Vitals:   06/25/22 0937   BP: 120/80  Pulse: 68  Height: 5\' 6"  (1.676 m)  Weight: 152 lb (68.9 kg)  SpO2: 98%  BMI (Calculated): 24.55    Physical Exam Vitals and nursing note reviewed.  Constitutional:      Appearance: Normal appearance.  HENT:     Head: Normocephalic and atraumatic.     Nose: Nose normal.  Cardiovascular:     Rate and Rhythm: Normal rate and regular rhythm.     Heart sounds: Normal heart sounds.  Pulmonary:     Effort: Pulmonary effort is normal.     Breath sounds: Normal breath sounds.  Abdominal:     Palpations: Abdomen is soft.  Musculoskeletal:        General: Normal range of motion.     Cervical back: Normal range of motion.  Skin:    General: Skin is warm and dry.  Neurological:     General: No focal deficit present.     Mental Status: She is alert and oriented to person, place, and time.  Psychiatric:        Mood and Affect: Mood normal.        Behavior: Behavior normal.      No results found for any visits on 06/25/22.  Recent Results (from the past 2160 hour(s))  CBC with Differential/Platelet     Status: Abnormal   Collection Time: 06/21/22 10:04 AM  Result Value Ref Range   WBC 6.0 3.4 - 10.8 x10E3/uL   RBC 3.57 (L) 3.77 - 5.28 x10E6/uL   Hemoglobin 10.1 (L) 11.1 - 15.9 g/dL   Hematocrit 28.4 (L) 13.2 - 46.6 %   MCV 91 79 - 97 fL   MCH 28.3 26.6 - 33.0 pg   MCHC 31.3 (L) 31.5 - 35.7 g/dL   RDW 44.0 10.2 - 72.5 %   Platelets 313 150 - 450 x10E3/uL   Neutrophils 61 Not Estab. %   Lymphs 26 Not Estab. %   Monocytes 8 Not Estab. %   Eos 3 Not Estab. %   Basos 2 Not Estab. %   Neutrophils Absolute 3.7 1.4 - 7.0 x10E3/uL   Lymphocytes Absolute 1.5 0.7 - 3.1 x10E3/uL   Monocytes Absolute 0.5 0.1 - 0.9 x10E3/uL   EOS (ABSOLUTE) 0.2 0.0 - 0.4 x10E3/uL   Basophils Absolute 0.1 0.0 - 0.2 x10E3/uL   Immature Granulocytes 0 Not Estab. %   Immature Grans (Abs) 0.0 0.0 - 0.1 x10E3/uL  Comprehensive metabolic panel     Status: Abnormal   Collection Time:  06/21/22 10:04 AM  Result Value Ref Range   Glucose 79 70 - 99 mg/dL   BUN 8 6 - 24 mg/dL   Creatinine, Ser 3.66 0.57 - 1.00 mg/dL   eGFR  115 >59 mL/min/1.73   BUN/Creatinine Ratio 13 9 - 23   Sodium 140 134 - 144 mmol/L   Potassium 4.3 3.5 - 5.2 mmol/L   Chloride 104 96 - 106 mmol/L   CO2 23 20 - 29 mmol/L   Calcium 8.8 8.7 - 10.2 mg/dL   Total Protein 6.4 6.0 - 8.5 g/dL   Albumin 4.2 3.9 - 4.9 g/dL   Globulin, Total 2.2 1.5 - 4.5 g/dL   Bilirubin Total 0.4 0.0 - 1.2 mg/dL   Alkaline Phosphatase 47 44 - 121 IU/L   AST 37 0 - 40 IU/L   ALT 75 (H) 0 - 32 IU/L  Lipid Panel w/o Chol/HDL Ratio     Status: None   Collection Time: 06/21/22 10:04 AM  Result Value Ref Range   Cholesterol, Total 161 100 - 199 mg/dL   Triglycerides 45 0 - 149 mg/dL   HDL 59 >78 mg/dL   VLDL Cholesterol Cal 10 5 - 40 mg/dL   LDL Chol Calc (NIH) 92 0 - 99 mg/dL  TSH     Status: None   Collection Time: 06/21/22 10:04 AM  Result Value Ref Range   TSH 0.480 0.450 - 4.500 uIU/mL      Assessment & Plan:  Take muscle relaxer as needed for MS pains. Repeat liver enzymes prior to next appointment.   Problem List Items Addressed This Visit     Multiple sclerosis (HCC) - Primary   Relevant Medications   ocrelizumab 600 mg in sodium chloride 0.9 % 500 mL   Migraine without aura and without status migrainosus, not intractable   Relevant Medications   baclofen (LIORESAL) 10 MG tablet   Elevated liver enzymes   Relevant Orders   Liver Profile   Other Visit Diagnoses     Impaired fasting blood sugar           Return in about 4 months (around 10/25/2022).   Total time spent: 25 minutes  Margaretann Loveless, MD  06/25/2022   This document may have been prepared by [facility] Voice Recognition software and as such may include unintentional dictation errors.

## 2022-06-28 DIAGNOSIS — E559 Vitamin D deficiency, unspecified: Secondary | ICD-10-CM | POA: Diagnosis not present

## 2022-06-28 DIAGNOSIS — R200 Anesthesia of skin: Secondary | ICD-10-CM | POA: Diagnosis not present

## 2022-06-28 DIAGNOSIS — G8929 Other chronic pain: Secondary | ICD-10-CM | POA: Diagnosis not present

## 2022-06-28 DIAGNOSIS — G479 Sleep disorder, unspecified: Secondary | ICD-10-CM | POA: Diagnosis not present

## 2022-06-28 DIAGNOSIS — G43011 Migraine without aura, intractable, with status migrainosus: Secondary | ICD-10-CM | POA: Diagnosis not present

## 2022-06-28 DIAGNOSIS — G35 Multiple sclerosis: Secondary | ICD-10-CM | POA: Diagnosis not present

## 2022-06-28 DIAGNOSIS — R202 Paresthesia of skin: Secondary | ICD-10-CM | POA: Diagnosis not present

## 2022-06-28 DIAGNOSIS — M545 Low back pain, unspecified: Secondary | ICD-10-CM | POA: Diagnosis not present

## 2022-06-28 DIAGNOSIS — H5350 Unspecified color vision deficiencies: Secondary | ICD-10-CM | POA: Diagnosis not present

## 2022-07-03 NOTE — Progress Notes (Signed)
Patient notified

## 2022-07-06 ENCOUNTER — Other Ambulatory Visit: Payer: Self-pay | Admitting: Physician Assistant

## 2022-07-06 DIAGNOSIS — G35 Multiple sclerosis: Secondary | ICD-10-CM

## 2022-07-06 DIAGNOSIS — R519 Headache, unspecified: Secondary | ICD-10-CM

## 2022-07-06 DIAGNOSIS — G8929 Other chronic pain: Secondary | ICD-10-CM

## 2022-07-06 DIAGNOSIS — R200 Anesthesia of skin: Secondary | ICD-10-CM

## 2022-07-11 DIAGNOSIS — H5350 Unspecified color vision deficiencies: Secondary | ICD-10-CM | POA: Diagnosis not present

## 2022-07-11 DIAGNOSIS — R519 Headache, unspecified: Secondary | ICD-10-CM | POA: Diagnosis not present

## 2022-07-11 DIAGNOSIS — G35 Multiple sclerosis: Secondary | ICD-10-CM | POA: Diagnosis not present

## 2022-07-11 DIAGNOSIS — G479 Sleep disorder, unspecified: Secondary | ICD-10-CM | POA: Diagnosis not present

## 2022-07-11 DIAGNOSIS — R202 Paresthesia of skin: Secondary | ICD-10-CM | POA: Diagnosis not present

## 2022-07-11 DIAGNOSIS — R200 Anesthesia of skin: Secondary | ICD-10-CM | POA: Diagnosis not present

## 2022-07-20 DIAGNOSIS — M25511 Pain in right shoulder: Secondary | ICD-10-CM | POA: Diagnosis not present

## 2022-07-20 DIAGNOSIS — M25512 Pain in left shoulder: Secondary | ICD-10-CM | POA: Diagnosis not present

## 2022-07-22 ENCOUNTER — Other Ambulatory Visit: Payer: Self-pay | Admitting: Internal Medicine

## 2022-07-24 ENCOUNTER — Encounter: Payer: Self-pay | Admitting: Physician Assistant

## 2022-07-25 DIAGNOSIS — M542 Cervicalgia: Secondary | ICD-10-CM | POA: Diagnosis not present

## 2022-07-25 DIAGNOSIS — M25511 Pain in right shoulder: Secondary | ICD-10-CM | POA: Diagnosis not present

## 2022-07-25 DIAGNOSIS — M546 Pain in thoracic spine: Secondary | ICD-10-CM | POA: Diagnosis not present

## 2022-07-25 DIAGNOSIS — M25512 Pain in left shoulder: Secondary | ICD-10-CM | POA: Diagnosis not present

## 2022-07-27 ENCOUNTER — Ambulatory Visit
Admission: RE | Admit: 2022-07-27 | Discharge: 2022-07-27 | Disposition: A | Discharge status: Home / Self Care | Payer: Medicare HMO | Source: Ambulatory Visit | Attending: Physician Assistant | Admitting: Physician Assistant

## 2022-07-27 DIAGNOSIS — R202 Paresthesia of skin: Secondary | ICD-10-CM

## 2022-07-27 DIAGNOSIS — M545 Low back pain, unspecified: Secondary | ICD-10-CM

## 2022-07-27 DIAGNOSIS — G35 Multiple sclerosis: Secondary | ICD-10-CM

## 2022-07-27 DIAGNOSIS — R519 Headache, unspecified: Secondary | ICD-10-CM

## 2022-07-27 DIAGNOSIS — R200 Anesthesia of skin: Secondary | ICD-10-CM

## 2022-07-27 DIAGNOSIS — G35D Multiple sclerosis, unspecified: Secondary | ICD-10-CM

## 2022-07-27 DIAGNOSIS — G8929 Other chronic pain: Secondary | ICD-10-CM

## 2022-07-27 MED ORDER — GADOPICLENOL 0.5 MMOL/ML IV SOLN
7.0000 mL | Freq: Once | INTRAVENOUS | Status: AC | PRN
  Administered 2022-07-27: 7 mL via INTRAVENOUS

## 2022-07-30 ENCOUNTER — Encounter: Payer: Self-pay | Admitting: Nurse Practitioner

## 2022-07-30 DIAGNOSIS — M542 Cervicalgia: Secondary | ICD-10-CM | POA: Diagnosis not present

## 2022-07-30 DIAGNOSIS — M25511 Pain in right shoulder: Secondary | ICD-10-CM | POA: Diagnosis not present

## 2022-07-30 DIAGNOSIS — M546 Pain in thoracic spine: Secondary | ICD-10-CM | POA: Diagnosis not present

## 2022-07-30 DIAGNOSIS — M25512 Pain in left shoulder: Secondary | ICD-10-CM | POA: Diagnosis not present

## 2022-08-01 DIAGNOSIS — M542 Cervicalgia: Secondary | ICD-10-CM | POA: Diagnosis not present

## 2022-08-01 DIAGNOSIS — M546 Pain in thoracic spine: Secondary | ICD-10-CM | POA: Diagnosis not present

## 2022-08-01 DIAGNOSIS — M25511 Pain in right shoulder: Secondary | ICD-10-CM | POA: Diagnosis not present

## 2022-08-01 DIAGNOSIS — M25512 Pain in left shoulder: Secondary | ICD-10-CM | POA: Diagnosis not present

## 2022-08-08 DIAGNOSIS — M25512 Pain in left shoulder: Secondary | ICD-10-CM | POA: Diagnosis not present

## 2022-08-08 DIAGNOSIS — M546 Pain in thoracic spine: Secondary | ICD-10-CM | POA: Diagnosis not present

## 2022-08-08 DIAGNOSIS — M25511 Pain in right shoulder: Secondary | ICD-10-CM | POA: Diagnosis not present

## 2022-08-08 DIAGNOSIS — M542 Cervicalgia: Secondary | ICD-10-CM | POA: Diagnosis not present

## 2022-08-13 DIAGNOSIS — M542 Cervicalgia: Secondary | ICD-10-CM | POA: Diagnosis not present

## 2022-08-13 DIAGNOSIS — M25512 Pain in left shoulder: Secondary | ICD-10-CM | POA: Diagnosis not present

## 2022-08-13 DIAGNOSIS — M546 Pain in thoracic spine: Secondary | ICD-10-CM | POA: Diagnosis not present

## 2022-08-13 DIAGNOSIS — M25511 Pain in right shoulder: Secondary | ICD-10-CM | POA: Diagnosis not present

## 2022-08-15 DIAGNOSIS — M25511 Pain in right shoulder: Secondary | ICD-10-CM | POA: Diagnosis not present

## 2022-08-15 DIAGNOSIS — M546 Pain in thoracic spine: Secondary | ICD-10-CM | POA: Diagnosis not present

## 2022-08-15 DIAGNOSIS — M25512 Pain in left shoulder: Secondary | ICD-10-CM | POA: Diagnosis not present

## 2022-08-15 DIAGNOSIS — M542 Cervicalgia: Secondary | ICD-10-CM | POA: Diagnosis not present

## 2022-08-20 ENCOUNTER — Other Ambulatory Visit: Payer: Self-pay | Admitting: Internal Medicine

## 2022-09-06 ENCOUNTER — Other Ambulatory Visit: Payer: Self-pay

## 2022-09-06 MED ORDER — BACLOFEN 10 MG PO TABS: 10.0000 mg | ORAL_TABLET | Freq: Two times a day (BID) | ORAL | 1 refills | Status: AC

## 2022-09-25 ENCOUNTER — Ambulatory Visit: Payer: Medicare HMO | Admitting: Obstetrics and Gynecology

## 2022-09-25 ENCOUNTER — Other Ambulatory Visit (HOSPITAL_COMMUNITY)
Admission: RE | Admit: 2022-09-25 | Discharge: 2022-09-25 | Disposition: A | Discharge status: Home / Self Care | Payer: Medicare HMO | Source: Ambulatory Visit | Attending: Obstetrics and Gynecology | Admitting: Obstetrics and Gynecology

## 2022-09-25 ENCOUNTER — Encounter: Payer: Self-pay | Admitting: Obstetrics and Gynecology

## 2022-09-25 VITALS — BP 97/67 | HR 93 | Ht 66.0 in | Wt 145.4 lb

## 2022-09-25 DIAGNOSIS — Z01419 Encounter for gynecological examination (general) (routine) without abnormal findings: Secondary | ICD-10-CM | POA: Insufficient documentation

## 2022-09-25 DIAGNOSIS — Z124 Encounter for screening for malignant neoplasm of cervix: Secondary | ICD-10-CM

## 2022-09-25 DIAGNOSIS — Z1151 Encounter for screening for human papillomavirus (HPV): Secondary | ICD-10-CM | POA: Insufficient documentation

## 2022-09-25 DIAGNOSIS — R748 Abnormal levels of other serum enzymes: Secondary | ICD-10-CM

## 2022-09-25 DIAGNOSIS — B977 Papillomavirus as the cause of diseases classified elsewhere: Secondary | ICD-10-CM

## 2022-09-25 DIAGNOSIS — G35 Multiple sclerosis: Secondary | ICD-10-CM | POA: Diagnosis not present

## 2022-09-25 DIAGNOSIS — Z1231 Encounter for screening mammogram for malignant neoplasm of breast: Secondary | ICD-10-CM

## 2022-09-25 NOTE — Progress Notes (Signed)
Patients presents for annual exam today. Patient states occasional spotting with her IUD. Due for pap smear ordered. Mammogram up to date. Annual labs are deferred to PCP. She states no other questions or concerns at this time.

## 2022-09-25 NOTE — Progress Notes (Signed)
HPI:      [name] is a 42 y.o. [phone] who LMP was No LMP recorded. (Menstrual status: IUD).  Subjective:   She presents today for her annual examination.  She states that she has lost some weight but it is now "stable".  Her last Pap smear showed positive HPV and negative cytology (4 years ago) patient did not follow-up.  She states that she was told she needed to come every 5 years by her PCP.  Her CBC shows decreased H&H. I have noted that her most recent CMP shows elevated liver enzymes. She has an IUD for birth control and has occasional spotting but denies menstrual periods. She does not check her strings.    Hx: The following portions of the patient's history were reviewed and updated as appropriate:             She  has a past medical history of Anemia and MS (multiple sclerosis) (HCC). She does not have any pertinent problems on file. She  has a past surgical history that includes Wisdom tooth extraction. Her family history is not on file. She  reports that she has never smoked. She has never used smokeless tobacco. She reports current drug use. Drug: Marijuana. She reports that she does not drink alcohol. She has a current medication list which includes the following prescription(s): baclofen, citalopram, ferrous sulfate, and ocrelizumab 600 mg in sodium chloride 0.9 % 500 mL. She is allergic to orange flavor [orange oil].       Review of Systems:  Review of Systems  Constitutional: Denied constitutional symptoms, night sweats, recent illness, fatigue, fever, insomnia and weight loss.  Eyes: Denied eye symptoms, eye pain, photophobia, vision change and visual disturbance.  Ears/Nose/Throat/Neck: Denied ear, nose, throat or neck symptoms, hearing loss, nasal discharge, sinus congestion and sore throat.  Cardiovascular: Denied cardiovascular symptoms, arrhythmia, chest pain/pressure, edema, exercise intolerance, orthopnea and palpitations.  Respiratory: Denied  pulmonary symptoms, asthma, pleuritic pain, productive sputum, cough, dyspnea and wheezing.  Gastrointestinal: Denied, gastro-esophageal reflux, melena, nausea and vomiting.  Genitourinary: Denied genitourinary symptoms including symptomatic vaginal discharge, pelvic relaxation issues, and urinary complaints.  Musculoskeletal: Denied musculoskeletal symptoms, stiffness, swelling, muscle weakness and myalgia.  Dermatologic: Denied dermatology symptoms, rash and scar.  Neurologic: Denied neurology symptoms, dizziness, headache, neck pain and syncope.  Psychiatric: Denied psychiatric symptoms, anxiety and depression.  Endocrine: Denied endocrine symptoms including hot flashes and night sweats.   Meds:   Current Outpatient Medications on File Prior to Visit  Medication Sig Dispense Refill   baclofen (LIORESAL) 10 MG tablet Take 1 tablet (10 mg total) by mouth 2 (two) times daily. (Patient not taking: Reported on 09/25/2022) 180 tablet 1   citalopram (CELEXA) 20 MG tablet TAKE 1 1/2 TABLET BY MOUTH DAILY (Patient not taking: Reported on 06/25/2022)     ferrous sulfate 325 (65 FE) MG tablet TAKE 1 TABLET BY MOUTH EVERY DAY WITH BREAKFAST (Patient not taking: Reported on 09/25/2022) 90 tablet 1   ocrelizumab 600 mg in sodium chloride 0.9 % 500 mL Inject 600 mg into the vein once. (Patient not taking: Reported on 09/25/2022)     No current facility-administered medications on file prior to visit.     Objective:     Vitals:   09/25/22 1532  BP: 97/67  Pulse: 93    Filed Weights   09/25/22 1532  Weight: 145 lb 6.4 oz (66 kg)  Physical examination General NAD, Conversant  HEENT Atraumatic; Op clear with mmm.  Normo-cephalic. Pupils reactive. Anicteric sclerae  Thyroid/Neck Smooth without nodularity or enlargement. Normal ROM.  Neck Supple.  Skin No rashes, lesions or ulceration. Normal palpated skin turgor. No nodularity.  Breasts: No masses or discharge.  Symmetric.  No axillary  adenopathy.  Lungs: Clear to auscultation.No rales or wheezes. Normal Respiratory effort, no retractions.  Heart: NSR.  No murmurs or rubs appreciated. No peripheral edema  Abdomen: Soft.  Non-tender.  No masses.  No HSM. No hernia  Extremities: Moves all appropriately.  Normal ROM for age. No lymphadenopathy.  Neuro: Oriented to PPT.  Normal mood. Normal affect.     Pelvic:   Vulva: Normal appearance.  No lesions.  Vagina: No lesions or abnormalities noted.  Support: Normal pelvic support.  Urethra No masses tenderness or scarring.  Meatus Normal size without lesions or prolapse.  Cervix: Normal appearance.  No lesions.  IUD strings noted at cervical os  Anus: Normal exam.  No lesions.  Perineum: Normal exam.  No lesions.        Bimanual   Uterus: Normal size.  Non-tender.  Mobile.  AV.  Adnexae: No masses.  Non-tender to palpation.  Cul-de-sac: Negative for abnormality.     Assessment:    [id number] Patient Active Problem List   Diagnosis Date Noted   Elevated liver enzymes 06/25/2022   Multiple sclerosis (HCC) 02/23/2022   Other fatigue 02/23/2022   Migraine without aura and without status migrainosus, not intractable 02/23/2022   Anxiety 02/23/2022   Subclinical hyperthyroidism 12/08/2021   Positive GBS test 05/02/2017   Advanced maternal age in multigravida, first trimester 05/02/2017     1. Well woman exam with routine gynecological exam   2. Cervical cancer screening   3. MS (multiple sclerosis) (HCC)   4. Elevated liver enzymes   5. 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  Basic Screening Recommendations The basic screening recommendations for asymptomatic women were discussed with the patient during her visit.  The age-appropriate recommendations were discussed with her and the rational for the tests  reviewed.  When I am informed by the patient that another primary care physician has previously obtained the age-appropriate tests and they are up-to-date, only outstanding tests are ordered and referrals given as necessary.  Abnormal results of tests will be discussed with her when all of her results are completed.  Routine preventative health maintenance measures emphasized: Exercise/Diet/Weight control, Tobacco Warnings, Alcohol/Substance use risks and Stress Management Pap performed -up-to-date on mammography 2.  Recheck CMP and hepatitis panel 3.  Abnormal Pap smears discussed and the necessity of follow-up including possibility of colposcopy.  Orders Orders Placed This Encounter  Procedures   Comp Met (CMET)   HepB+HepC+HIV Panel    No orders of the defined types were placed in this encounter.          F/U  Return in about 1 year (around 09/25/2023) for Annual Physical.  Elonda Husky, M.D. 09/25/2022 4:09 PM

## 2022-09-26 LAB — HEPB+HEPC+HIV PANEL
HIV Screen 4th Generation wRfx: NONREACTIVE
Hep B C IgM: NEGATIVE
Hep B Core Total Ab: NEGATIVE
Hep B E Ab: NONREACTIVE
Hep B E Ag: NEGATIVE
Hep B Surface Ab, Qual: REACTIVE
Hep C Virus Ab: NONREACTIVE
Hepatitis B Surface Ag: NEGATIVE

## 2022-09-26 LAB — COMPREHENSIVE METABOLIC PANEL
ALT: 14 IU/L (ref 0–32)
AST: 15 IU/L (ref 0–40)
Albumin: 4.4 g/dL (ref 3.9–4.9)
Alkaline Phosphatase: 50 IU/L (ref 44–121)
BUN/Creatinine Ratio: 9 (ref 9–23)
BUN: 7 mg/dL (ref 6–24)
Bilirubin Total: 0.3 mg/dL (ref 0.0–1.2)
CO2: 23 mmol/L (ref 20–29)
Calcium: 9.2 mg/dL (ref 8.7–10.2)
Chloride: 100 mmol/L (ref 96–106)
Creatinine, Ser: 0.76 mg/dL (ref 0.57–1.00)
Globulin, Total: 2.8 g/dL (ref 1.5–4.5)
Glucose: 83 mg/dL (ref 70–99)
Potassium: 4.4 mmol/L (ref 3.5–5.2)
Sodium: 137 mmol/L (ref 134–144)
Total Protein: 7.2 g/dL (ref 6.0–8.5)
eGFR: 101 mL/min/{1.73_m2} (ref >59)

## 2022-09-28 ENCOUNTER — Encounter: Payer: Self-pay | Admitting: Obstetrics and Gynecology

## 2022-10-01 LAB — CYTOLOGY - PAP
Comment: NEGATIVE
Diagnosis: NEGATIVE
High risk HPV: NEGATIVE

## 2022-10-25 ENCOUNTER — Ambulatory Visit: Payer: Medicare HMO | Admitting: Cardiology

## 2022-10-30 DIAGNOSIS — Z79899 Other long term (current) drug therapy: Secondary | ICD-10-CM | POA: Diagnosis not present

## 2022-10-30 DIAGNOSIS — G43011 Migraine without aura, intractable, with status migrainosus: Secondary | ICD-10-CM | POA: Diagnosis not present

## 2022-10-30 DIAGNOSIS — M25511 Pain in right shoulder: Secondary | ICD-10-CM | POA: Diagnosis not present

## 2022-10-30 DIAGNOSIS — H5350 Unspecified color vision deficiencies: Secondary | ICD-10-CM | POA: Diagnosis not present

## 2022-10-30 DIAGNOSIS — G35 Multiple sclerosis: Secondary | ICD-10-CM | POA: Diagnosis not present

## 2022-10-30 DIAGNOSIS — R200 Anesthesia of skin: Secondary | ICD-10-CM | POA: Diagnosis not present

## 2022-10-30 DIAGNOSIS — M545 Low back pain, unspecified: Secondary | ICD-10-CM | POA: Diagnosis not present

## 2022-10-30 DIAGNOSIS — G479 Sleep disorder, unspecified: Secondary | ICD-10-CM | POA: Diagnosis not present

## 2022-10-30 DIAGNOSIS — E559 Vitamin D deficiency, unspecified: Secondary | ICD-10-CM | POA: Diagnosis not present

## 2023-01-06 ENCOUNTER — Other Ambulatory Visit: Payer: Self-pay

## 2023-01-07 MED ORDER — TRIAMCINOLONE ACETONIDE 0.1 % EX CREA: 1.0000 | TOPICAL_CREAM | Freq: Every day | CUTANEOUS | 5 refills | Status: AC

## 2023-01-30 DIAGNOSIS — M545 Low back pain, unspecified: Secondary | ICD-10-CM | POA: Diagnosis not present

## 2023-01-30 DIAGNOSIS — G8929 Other chronic pain: Secondary | ICD-10-CM | POA: Diagnosis not present

## 2023-01-30 DIAGNOSIS — R519 Headache, unspecified: Secondary | ICD-10-CM | POA: Diagnosis not present

## 2023-01-30 DIAGNOSIS — R200 Anesthesia of skin: Secondary | ICD-10-CM | POA: Diagnosis not present

## 2023-01-30 DIAGNOSIS — H5350 Unspecified color vision deficiencies: Secondary | ICD-10-CM | POA: Diagnosis not present

## 2023-01-30 DIAGNOSIS — Z79899 Other long term (current) drug therapy: Secondary | ICD-10-CM | POA: Diagnosis not present

## 2023-01-30 DIAGNOSIS — G35 Multiple sclerosis: Secondary | ICD-10-CM | POA: Diagnosis not present

## 2023-01-30 DIAGNOSIS — M25511 Pain in right shoulder: Secondary | ICD-10-CM | POA: Diagnosis not present

## 2023-01-30 DIAGNOSIS — G479 Sleep disorder, unspecified: Secondary | ICD-10-CM | POA: Diagnosis not present

## 2023-03-28 ENCOUNTER — Encounter: Payer: Self-pay | Admitting: Internal Medicine

## 2023-07-27 ENCOUNTER — Other Ambulatory Visit: Payer: Self-pay | Admitting: Cardiology

## 2023-11-29 ENCOUNTER — Ambulatory Visit: Admitting: Cardiology
